# Patient Record
Sex: Male | Born: 1985 | Race: White | Hispanic: No | Marital: Married | Smoking: Never smoker
Health system: Southern US, Community
[De-identification: ages and names within clinical notes are randomized; demographics above are authoritative.]

## PROBLEM LIST (undated history)

## (undated) ENCOUNTER — Ambulatory Visit: Source: Home / Self Care

## (undated) DIAGNOSIS — E079 Disorder of thyroid, unspecified: Secondary | ICD-10-CM

## (undated) HISTORY — PX: APPENDECTOMY: SHX54

## (undated) HISTORY — PX: ANKLE SURGERY: SHX546

## (undated) HISTORY — PX: FASCIECTOMY: SHX6525

## (undated) HISTORY — PX: HEMORROIDECTOMY: SUR656

## (undated) HISTORY — PX: THYROIDECTOMY: SHX17

## (undated) HISTORY — PX: BRAIN SURGERY: SHX531

---

## 2016-10-17 DIAGNOSIS — G540 Brachial plexus disorders: Secondary | ICD-10-CM | POA: Insufficient documentation

## 2016-11-14 DIAGNOSIS — I471 Supraventricular tachycardia: Secondary | ICD-10-CM | POA: Insufficient documentation

## 2016-11-14 DIAGNOSIS — Z8679 Personal history of other diseases of the circulatory system: Secondary | ICD-10-CM | POA: Insufficient documentation

## 2017-06-05 DIAGNOSIS — G930 Cerebral cysts: Secondary | ICD-10-CM | POA: Insufficient documentation

## 2017-07-14 DIAGNOSIS — C73 Malignant neoplasm of thyroid gland: Secondary | ICD-10-CM | POA: Insufficient documentation

## 2018-07-17 DIAGNOSIS — E890 Postprocedural hypothyroidism: Secondary | ICD-10-CM | POA: Insufficient documentation

## 2019-03-05 DIAGNOSIS — K219 Gastro-esophageal reflux disease without esophagitis: Secondary | ICD-10-CM | POA: Insufficient documentation

## 2019-03-05 DIAGNOSIS — Z87898 Personal history of other specified conditions: Secondary | ICD-10-CM | POA: Insufficient documentation

## 2019-07-04 DIAGNOSIS — G8929 Other chronic pain: Secondary | ICD-10-CM | POA: Insufficient documentation

## 2019-12-20 DIAGNOSIS — N529 Male erectile dysfunction, unspecified: Secondary | ICD-10-CM | POA: Insufficient documentation

## 2020-02-07 DIAGNOSIS — R911 Solitary pulmonary nodule: Secondary | ICD-10-CM | POA: Insufficient documentation

## 2020-05-12 ENCOUNTER — Encounter (HOSPITAL_BASED_OUTPATIENT_CLINIC_OR_DEPARTMENT_OTHER): Payer: Self-pay

## 2020-05-12 ENCOUNTER — Emergency Department (HOSPITAL_BASED_OUTPATIENT_CLINIC_OR_DEPARTMENT_OTHER): Payer: 59

## 2020-05-12 ENCOUNTER — Emergency Department (HOSPITAL_BASED_OUTPATIENT_CLINIC_OR_DEPARTMENT_OTHER)
Admission: EM | Admit: 2020-05-12 | Discharge: 2020-05-12 | Disposition: A | Discharge status: Home / Self Care | Payer: 59 | Attending: Emergency Medicine | Admitting: Emergency Medicine

## 2020-05-12 ENCOUNTER — Other Ambulatory Visit: Payer: Self-pay

## 2020-05-12 DIAGNOSIS — R0789 Other chest pain: Secondary | ICD-10-CM | POA: Diagnosis not present

## 2020-05-12 DIAGNOSIS — R112 Nausea with vomiting, unspecified: Secondary | ICD-10-CM | POA: Insufficient documentation

## 2020-05-12 DIAGNOSIS — R1011 Right upper quadrant pain: Secondary | ICD-10-CM | POA: Insufficient documentation

## 2020-05-12 HISTORY — DX: Disorder of thyroid, unspecified: E07.9

## 2020-05-12 LAB — URINALYSIS, ROUTINE W REFLEX MICROSCOPIC
Bilirubin Urine: NEGATIVE
Glucose, UA: NEGATIVE mg/dL
Hgb urine dipstick: NEGATIVE
Ketones, ur: NEGATIVE mg/dL
Leukocytes,Ua: NEGATIVE
Nitrite: NEGATIVE
Protein, ur: NEGATIVE mg/dL
Specific Gravity, Urine: 1.025 (ref 1.005–1.030)
pH: 6 (ref 5.0–8.0)

## 2020-05-12 LAB — COMPREHENSIVE METABOLIC PANEL
ALT: 23 U/L (ref 0–44)
AST: 20 U/L (ref 15–41)
Albumin: 4.6 g/dL (ref 3.5–5.0)
Alkaline Phosphatase: 65 U/L (ref 38–126)
Anion gap: 11 (ref 5–15)
BUN: 12 mg/dL (ref 6–20)
CO2: 27 mmol/L (ref 22–32)
Calcium: 9.1 mg/dL (ref 8.9–10.3)
Chloride: 97 mmol/L — ABNORMAL LOW (ref 98–111)
Creatinine, Ser: 0.89 mg/dL (ref 0.61–1.24)
GFR calc Af Amer: >60 mL/min (ref >60)
GFR calc non Af Amer: >60 mL/min (ref >60)
Glucose, Bld: 67 mg/dL — ABNORMAL LOW (ref 70–99)
Potassium: 3.6 mmol/L (ref 3.5–5.1)
Sodium: 135 mmol/L (ref 135–145)
Total Bilirubin: 1.1 mg/dL (ref 0.3–1.2)
Total Protein: 7.7 g/dL (ref 6.5–8.1)

## 2020-05-12 LAB — LIPASE, BLOOD: Lipase: 101 U/L — ABNORMAL HIGH (ref 11–51)

## 2020-05-12 LAB — CBC
HCT: 47.2 % (ref 39.0–52.0)
Hemoglobin: 16.1 g/dL (ref 13.0–17.0)
MCH: 28.4 pg (ref 26.0–34.0)
MCHC: 34.1 g/dL (ref 30.0–36.0)
MCV: 83.4 fL (ref 80.0–100.0)
Platelets: 298 10*3/uL (ref 150–400)
RBC: 5.66 MIL/uL (ref 4.22–5.81)
RDW: 12.7 % (ref 11.5–15.5)
WBC: 7.1 10*3/uL (ref 4.0–10.5)
nRBC: 0 % (ref 0.0–0.2)

## 2020-05-12 LAB — CBG MONITORING, ED: Glucose-Capillary: 113 mg/dL — ABNORMAL HIGH (ref 70–99)

## 2020-05-12 MED ORDER — SODIUM CHLORIDE 0.9 % IV BOLUS
1000.0000 mL | Freq: Once | INTRAVENOUS | Status: AC
  Administered 2020-05-12: 1000 mL via INTRAVENOUS

## 2020-05-12 MED ORDER — SODIUM CHLORIDE 0.9% FLUSH
3.0000 mL | Freq: Once | INTRAVENOUS | Status: DC
  Filled 2020-05-12: qty 3

## 2020-05-12 MED ORDER — ONDANSETRON 4 MG PO TBDP
4.0000 mg | ORAL_TABLET | Freq: Three times a day (TID) | ORAL | 0 refills | Status: DC | PRN
Start: 2020-05-12 — End: 2022-05-17

## 2020-05-12 MED ORDER — IOHEXOL 300 MG/ML  SOLN
100.0000 mL | Freq: Once | INTRAMUSCULAR | Status: AC | PRN
  Administered 2020-05-12: 100 mL via INTRAVENOUS

## 2020-05-12 NOTE — Discharge Instructions (Addendum)
Take Zofran as needed for nausea.  Please follow-up with a primary care provider.  If you are unable to find a primary care provider you may also follow-up with [facility].  Return to the emergency room immediately for new or worsening symptoms or concerns, such as new or worsening abdominal pain, fevers, chest pain, shortness of breath or any concerns at all.

## 2020-05-12 NOTE — ED Provider Notes (Signed)
[facility] EMERGENCY DEPARTMENT Provider Note   CSN: [id number] Arrival date & time: 05/12/20  1557     History Chief Complaint  Patient presents with  . Abdominal Pain    Carl Tanner is a 34 y.o. male.  HPI  34 year old male, with a PMH of hypothyroidism, presents with right upper quadrant abdominal pain.  Patient states gradually over the last 5 days he has had worsening right upper quadrant abdominal pain.  He notes associated nausea and vomiting.  He states he has vomited one time today.  He notes constipation with his last bowel movement several days ago.  He denies any hematochezia, melena, hematemesis, fevers, chills.  Patient also complaining of chest pain to the left side of the chest which he states he has had for many months.  He states that symptoms have been gradually worsening.  He was told that he had a "spot on that side."  He would like to get that checked out while he is here.  He denies any shortness of breath.  Past Medical History:  Diagnosis Date  . Thyroid disease     There are no problems to display for this patient.   Past Surgical History:  Procedure Laterality Date  . ANKLE SURGERY    . APPENDECTOMY    . BRAIN SURGERY    . FASCIECTOMY    . HEMORROIDECTOMY    . THYROIDECTOMY         No family history on file.  Social History   Tobacco Use  . Smoking status: Never Smoker  . Smokeless tobacco: Never Used  Vaping Use  . Vaping Use: Every day  Substance Use Topics  . Alcohol use: Never  . Drug use: Never    Home Medications Prior to Admission medications   Not on File    Allergies    Benzodiazepines and Compazine [prochlorperazine]  Review of Systems   Review of Systems  Constitutional: Negative for chills and fever.  Respiratory: Negative for shortness of breath.   Cardiovascular: Positive for chest pain.  Gastrointestinal: Positive for abdominal pain, constipation, nausea and vomiting.  Genitourinary: Negative  for dysuria and frequency.  All other systems reviewed and are negative.   Physical Exam Updated Vital Signs BP 118/70 (BP Location: Right Arm)   Pulse 62   Temp 97.8 F (36.6 C) (Oral)   Resp 16   Ht 5\' 10"  (1.778 m)   Wt 103 kg   SpO2 99%   BMI 32.57 kg/m   Physical Exam Vitals and nursing note reviewed.  Constitutional:      Appearance: He is well-developed.  HENT:     Head: Normocephalic and atraumatic.  Eyes:     Conjunctiva/sclera: Conjunctivae normal.  Cardiovascular:     Rate and Rhythm: Normal rate and regular rhythm.     Heart sounds: Normal heart sounds. No murmur heard.   Pulmonary:     Effort: Pulmonary effort is normal. No respiratory distress.     Breath sounds: Normal breath sounds. No wheezing or rales.  Abdominal:     General: Bowel sounds are normal. There is no distension.     Palpations: Abdomen is soft.     Tenderness: There is abdominal tenderness in the right upper quadrant. Negative signs include McBurney's sign.  Musculoskeletal:        General: No tenderness or deformity. Normal range of motion.     Cervical back: Neck supple.  Skin:    General: Skin is warm and dry.  Findings: No erythema or rash.  Neurological:     Mental Status: He is alert and oriented to person, place, and time.  Psychiatric:        Behavior: Behavior normal.     ED Results / Procedures / Treatments   Labs (all labs ordered are listed, but only abnormal results are displayed) Labs Reviewed  LIPASE, BLOOD - Abnormal; Notable for the following components:      Result Value   Lipase 101 (*)    All other components within normal limits  COMPREHENSIVE METABOLIC PANEL - Abnormal; Notable for the following components:   Chloride 97 (*)    Glucose, Bld 67 (*)    All other components within normal limits  CBG MONITORING, ED - Abnormal; Notable for the following components:   Glucose-Capillary 113 (*)    All other components within normal limits  CBC    URINALYSIS, ROUTINE W REFLEX MICROSCOPIC    EKG None  Radiology DG Chest 2 View  Result Date: 05/12/2020 CLINICAL DATA:  Chest pain EXAM: CHEST - 2 VIEW COMPARISON:  None. FINDINGS: The heart size and mediastinal contours are within normal limits. Both lungs are clear. The visualized skeletal structures are unremarkable. IMPRESSION: No acute process in the chest. Electronically Signed   By: Macy Mis M.D.   On: 05/12/2020 20:04   CT Abdomen Pelvis W Contrast  Result Date: 05/12/2020 CLINICAL DATA:  Acute nonlocalized abdominal pain with nausea for 5 days and emesis. EXAM: CT ABDOMEN AND PELVIS WITH CONTRAST TECHNIQUE: Multidetector CT imaging of the abdomen and pelvis was performed using the standard protocol following bolus administration of intravenous contrast. CONTRAST:  140mL OMNIPAQUE IOHEXOL 300 MG/ML  SOLN COMPARISON:  CT 04/22/2020 FINDINGS: Lower chest: Lung bases are clear. Normal heart size. No pericardial effusion. Hepatobiliary: Few subcentimeter hypodense foci in the liver too small to fully characterize on CT imaging but statistically likely benign. No worrisome focal liver lesions. Smooth liver surface contour. Normal hepatic attenuation. Small prominent fold towards the gallbladder fundus. No visible calcified gallstones, pericholecystic inflammation or biliary ductal dilatation. Pancreas: Unremarkable. No pancreatic ductal dilatation or surrounding inflammatory changes. Spleen: Normal in size. No concerning splenic lesions. Adrenals/Urinary Tract: Normal adrenal glands. Kidneys enhance symmetrically and uniformly. No perinephric stranding. No concerning renal mass. Few subcentimeter hypodense foci in the left interpolar and lower pole kidney are incompletely characterized but likely benign. No urolithiasis or hydronephrosis. Urinary bladder is largely decompressed at the time of exam and therefore poorly evaluated by CT imaging. Some prominent bladder wall thickening is slightly  greater than expected with faint perivesicular hazy stranding as well. No visible bladder calculi or debris. Stomach/Bowel: Distal esophagus, stomach and duodenal sweep are unremarkable. No small bowel wall thickening or dilatation. No evidence of obstruction. The appendix is surgically absent. Cecum displaced slightly into the right upper quadrant. No evidence of resulting obstruction/volvulus. No colonic dilatation or wall thickening. Vascular/Lymphatic: No significant vascular findings are present. No enlarged abdominal or pelvic lymph nodes. Reproductive: Tiny 7 mm hypoattenuating focus in the midline prostate could reflect a small prosthetic utricle cyst or mullerian duct cyst. Additional more posteroinferior calculation towards the prosthetic apex is unchanged from prior and appears separate from the expected course of the ureter. Seminal vesicles are unremarkable. Other: No abdominopelvic free fluid or free gas. No bowel containing hernias. Small fat containing left inguinal hernia. Minimal subcutaneous thickening in the right anterior abdominal wall may reflect injectable use such as insulin. Musculoskeletal: No acute osseous abnormality or  suspicious osseous lesion. IMPRESSION: 1. Bladder is largely decompressed and poorly evaluated by CT imaging however some prominent bladder wall thickening is slightly greater than expected for underdistention with faint perivesicular hazy stranding as well. Correlate with urinalysis to exclude cystitis. 2. Tiny 7 mm hypoattenuating focus in the midline prostate could reflect a small prosthetic utricle cyst or mullerian duct cyst. Could consider outpatient urologic consultation. 3. Small fat containing left inguinal hernia. 4. Minimal subcutaneous thickening in the right anterior abdominal wall may reflect injectable use such as insulin. Electronically Signed   By: Lovena Le M.D.   On: 05/12/2020 21:07    Procedures Procedures (including critical care  time)  Medications Ordered in ED Medications  sodium chloride flush (NS) 0.9 % injection 3 mL (has no administration in time range)  iohexol (OMNIPAQUE) 300 MG/ML solution 100 mL (100 mLs Intravenous Contrast Given 05/12/20 2048)  sodium chloride 0.9 % bolus 1,000 mL (1,000 mLs Intravenous New Bag/Given 05/12/20 2147)    ED Course  I have reviewed the triage vital signs and the nursing notes.  Pertinent labs & imaging results that were available during my care of the patient were reviewed by me and considered in my medical decision making (see chart for details).    MDM Rules/Calculators/A&P                          Presents with right upper quadrant abdominal pain, nausea and vomiting.  He is tender in the right upper quadrant but does have a negative Murphy sign.  Blood work remarkable for hyperglycemia.  Lipase elevated at 101, 2 times upper limits of normal.  Urine shows no evidence of UTI.  Patient declined pain medicine in the ED.  CT shows multiple nonspecific findings.  In regards to the bladder findings, patient denies any urinary symptoms and urine is not suggestive of UTI.  No acute cholecystitis or pancreatitis on CT scan.  Discussed findings with patient.  He states that the bolus of fluids is helping his abdominal pain.  Discussed that CT is not the ideal imaging for acute cholecystitis and he was given strict return precautions in regards to this.  However given improvement in symptoms with normal saline, and minimal right upper quadrant tenderness, history of this was not consistent with acute cholecystitis.  Discussed elevated lipase with patient.  He is p.o. tolerant to crackers and Sprite in the room.  Sugars have improved to 113.  Patient given strict return precautions.  He is ready and stable for discharge.  In regards to patient's chest pain, this has been going on for once.  Chest x-ray is clear.  EKG shows no acute ST changes.  History and physical not consistent with ACS,  pericarditis, dissection.   At this time there does not appear to be any evidence of an acute emergency medical condition and the patient appears stable for discharge with appropriate outpatient follow up.Diagnosis was discussed with patient who verbalizes understanding and is agreeable to discharge. Pt case discussed with Dr. Ronnald Nian who agrees with my plan.    Final Clinical Impression(s) / ED Diagnoses Final diagnoses:  RUQ pain    Rx / DC Orders ED Discharge Orders    None       Etter Sjogren, PA-C 05/12/20 [place], [place], DO 05/12/20 2302

## 2020-05-12 NOTE — ED Notes (Signed)
Pt has a urinal. Pt is aware he needs a urine sample

## 2020-05-12 NOTE — ED Triage Notes (Addendum)
Pt c/o right side abd pain, nausea x 5 days-vomited x 1-NAD-steady gait

## 2020-05-17 ENCOUNTER — Encounter (HOSPITAL_BASED_OUTPATIENT_CLINIC_OR_DEPARTMENT_OTHER): Payer: Self-pay | Admitting: Emergency Medicine

## 2020-05-17 ENCOUNTER — Emergency Department (HOSPITAL_BASED_OUTPATIENT_CLINIC_OR_DEPARTMENT_OTHER)
Admission: EM | Admit: 2020-05-17 | Discharge: 2020-05-17 | Disposition: A | Discharge status: Home / Self Care | Payer: 59 | Attending: Emergency Medicine | Admitting: Emergency Medicine

## 2020-05-17 ENCOUNTER — Other Ambulatory Visit: Payer: Self-pay

## 2020-05-17 DIAGNOSIS — N3289 Other specified disorders of bladder: Secondary | ICD-10-CM

## 2020-05-17 DIAGNOSIS — R300 Dysuria: Secondary | ICD-10-CM | POA: Diagnosis not present

## 2020-05-17 DIAGNOSIS — R109 Unspecified abdominal pain: Secondary | ICD-10-CM | POA: Diagnosis present

## 2020-05-17 DIAGNOSIS — R1032 Left lower quadrant pain: Secondary | ICD-10-CM | POA: Diagnosis not present

## 2020-05-17 DIAGNOSIS — R319 Hematuria, unspecified: Secondary | ICD-10-CM | POA: Diagnosis not present

## 2020-05-17 DIAGNOSIS — Z9089 Acquired absence of other organs: Secondary | ICD-10-CM | POA: Diagnosis not present

## 2020-05-17 DIAGNOSIS — R1031 Right lower quadrant pain: Secondary | ICD-10-CM | POA: Insufficient documentation

## 2020-05-17 DIAGNOSIS — R110 Nausea: Secondary | ICD-10-CM | POA: Diagnosis not present

## 2020-05-17 LAB — COMPREHENSIVE METABOLIC PANEL
ALT: 17 U/L (ref 0–44)
AST: 19 U/L (ref 15–41)
Albumin: 4.3 g/dL (ref 3.5–5.0)
Alkaline Phosphatase: 54 U/L (ref 38–126)
Anion gap: 10 (ref 5–15)
BUN: 11 mg/dL (ref 6–20)
CO2: 24 mmol/L (ref 22–32)
Calcium: 9.1 mg/dL (ref 8.9–10.3)
Chloride: 102 mmol/L (ref 98–111)
Creatinine, Ser: 0.94 mg/dL (ref 0.61–1.24)
GFR calc Af Amer: >60 mL/min (ref >60)
GFR calc non Af Amer: >60 mL/min (ref >60)
Glucose, Bld: 97 mg/dL (ref 70–99)
Potassium: 3.8 mmol/L (ref 3.5–5.1)
Sodium: 136 mmol/L (ref 135–145)
Total Bilirubin: 0.7 mg/dL (ref 0.3–1.2)
Total Protein: 6.9 g/dL (ref 6.5–8.1)

## 2020-05-17 LAB — CBC WITH DIFFERENTIAL/PLATELET
Abs Immature Granulocytes: 0.05 10*3/uL (ref 0.00–0.07)
Basophils Absolute: 0.1 10*3/uL (ref 0.0–0.1)
Basophils Relative: 1 %
Eosinophils Absolute: 0.1 10*3/uL (ref 0.0–0.5)
Eosinophils Relative: 2 %
HCT: 44.2 % (ref 39.0–52.0)
Hemoglobin: 15.6 g/dL (ref 13.0–17.0)
Immature Granulocytes: 1 %
Lymphocytes Relative: 31 %
Lymphs Abs: 2.2 10*3/uL (ref 0.7–4.0)
MCH: 28.9 pg (ref 26.0–34.0)
MCHC: 35.3 g/dL (ref 30.0–36.0)
MCV: 81.9 fL (ref 80.0–100.0)
Monocytes Absolute: 0.7 10*3/uL (ref 0.1–1.0)
Monocytes Relative: 10 %
Neutro Abs: 4 10*3/uL (ref 1.7–7.7)
Neutrophils Relative %: 55 %
Platelets: 322 10*3/uL (ref 150–400)
RBC: 5.4 MIL/uL (ref 4.22–5.81)
RDW: 12.4 % (ref 11.5–15.5)
WBC: 7 10*3/uL (ref 4.0–10.5)
nRBC: 0 % (ref 0.0–0.2)

## 2020-05-17 LAB — URINALYSIS, MICROSCOPIC (REFLEX): RBC / HPF: NONE SEEN RBC/hpf (ref 0–5)

## 2020-05-17 LAB — URINALYSIS, ROUTINE W REFLEX MICROSCOPIC
Bilirubin Urine: NEGATIVE
Glucose, UA: NEGATIVE mg/dL
Hgb urine dipstick: NEGATIVE
Ketones, ur: NEGATIVE mg/dL
Nitrite: NEGATIVE
Protein, ur: NEGATIVE mg/dL
Specific Gravity, Urine: 1.01 (ref 1.005–1.030)
pH: 7 (ref 5.0–8.0)

## 2020-05-17 LAB — LIPASE, BLOOD: Lipase: 25 U/L (ref 11–51)

## 2020-05-17 MED ORDER — MORPHINE SULFATE (PF) 4 MG/ML IV SOLN
4.0000 mg | Freq: Once | INTRAVENOUS | Status: AC
  Administered 2020-05-17: 4 mg via INTRAVENOUS
  Filled 2020-05-17: qty 1

## 2020-05-17 MED ORDER — PHENAZOPYRIDINE HCL 100 MG PO TABS
200.0000 mg | ORAL_TABLET | Freq: Once | ORAL | Status: AC
  Administered 2020-05-17: 200 mg via ORAL
  Filled 2020-05-17: qty 2

## 2020-05-17 MED ORDER — ONDANSETRON HCL 4 MG/2ML IJ SOLN
4.0000 mg | Freq: Once | INTRAMUSCULAR | Status: AC
  Administered 2020-05-17: 4 mg via INTRAVENOUS
  Filled 2020-05-17: qty 2

## 2020-05-17 MED ORDER — PHENAZOPYRIDINE HCL 200 MG PO TABS: 200.0000 mg | ORAL_TABLET | Freq: Three times a day (TID) | ORAL | 0 refills | Status: DC

## 2020-05-17 NOTE — Discharge Instructions (Signed)
Please read and follow all provided instructions.  Your diagnoses today include:  1. Bladder spasms     Tests performed today include:  Blood counts and electrolytes  Blood tests to check liver and kidney function  Blood tests to check pancreas function  Urine test to look for infection  Vital signs. See below for your results today.   Medications prescribed:   Pyridium - medication for urinary tract infection symptoms.   This medication will turn your urine orange. This is normal.   Take any prescribed medications only as directed.  Home care instructions:   Follow any educational materials contained in this packet.  Follow-up instructions: Please follow-up with your primary care provider or the urologist listed in the next week for further evaluation of your symptoms.  Return instructions:  SEEK IMMEDIATE MEDICAL ATTENTION IF:  The pain does not go away or becomes severe   A temperature above 101F develops   Repeated vomiting occurs (multiple episodes)   The pain becomes localized to portions of the abdomen. The right side could possibly be appendicitis. In an adult, the left lower portion of the abdomen could be colitis or diverticulitis.   Blood is being passed in stools or vomit (bright red or black tarry stools)   You develop chest pain, difficulty breathing, dizziness or fainting, or become confused, poorly responsive, or inconsolable (young children)  If you have any other emergent concerns regarding your health  Additional Information: Abdominal (belly) pain can be caused by many things. Your caregiver performed an examination and possibly ordered blood/urine tests and imaging (CT scan, x-rays, ultrasound). Many cases can be observed and treated at home after initial evaluation in the emergency department. Even though you are being discharged home, abdominal pain can be unpredictable. Therefore, you need a repeated exam if your pain does not resolve,  returns, or worsens. Most patients with abdominal pain don't have to be admitted to the hospital or have surgery, but serious problems like appendicitis and gallbladder attacks can start out as nonspecific pain. Many abdominal conditions cannot be diagnosed in one visit, so follow-up evaluations are very important.  Your vital signs today were: BP 116/76 (BP Location: Right Arm)   Pulse 87   Temp 98.6 F (37 C) (Oral)   Resp 20   Ht 5\' 10"  (1.778 m)   Wt 103 kg   SpO2 99%   BMI 32.57 kg/m  If your blood pressure (bp) was elevated above 135/85 this visit, please have this repeated by your doctor within one month. --------------

## 2020-05-17 NOTE — ED Provider Notes (Signed)
[facility] EMERGENCY DEPARTMENT Provider Note   CSN: [id number] Arrival date & time: 05/17/20  1635     History Chief Complaint  Patient presents with  . Abdominal Pain    Carl Tanner is a 34 y.o. male.  Patient with history of appendectomy, history of testicle swelling and hematuria, states he was seen about 8 months ago by a urologist who wanted to do a cystoscopy, but then his urologist left and he never had this done. ED visit on 05/12/2020 for right upper quadrant pain with CT with questionable bladder wall thickening and surrounding stranding, normal UA, elevated lipase, normal white blood cell count.  He presents the emergency department today with complaint of bladder spasms and lower abdominal pain as well as ongoing, but improved overall right upper quadrant pain.  Patient denies fevers, chest pain, cough or shortness of breath.  He has had nausea but no vomiting.  He states that this morning he noted a larger amount of blood in his urine which worried him prompting emergency department visit.  He also states that he has extreme urgency to the point where he urinates on himself.  He states that he was incontinent 3 times this morning.  He states later this morning he went to a grocery store and lifted a case of water and has had pain in his middle back since that time.  No treatments prior to arrival.  He does not recall ever having a UTI. Pt with chronic rx for oxycodone 5mg , Vyvanse, and gabapentin.         Past Medical History:  Diagnosis Date  . Thyroid disease     There are no problems to display for this patient.   Past Surgical History:  Procedure Laterality Date  . ANKLE SURGERY    . APPENDECTOMY    . BRAIN SURGERY    . FASCIECTOMY    . HEMORROIDECTOMY    . THYROIDECTOMY         No family history on file.  Social History   Tobacco Use  . Smoking status: Never Smoker  . Smokeless tobacco: Never Used  Vaping Use  . Vaping Use: Every day    Substance Use Topics  . Alcohol use: Never  . Drug use: Never    Home Medications Prior to Admission medications   Medication Sig Start Date End Date Taking? Authorizing Provider  ondansetron (ZOFRAN ODT) 4 MG disintegrating tablet Take 1 tablet (4 mg total) by mouth every 8 (eight) hours as needed for nausea or vomiting. 05/12/20   Kendrick, Caitlyn S, PA-C    Allergies    Benzodiazepines and Compazine [prochlorperazine]  Review of Systems   Review of Systems  Constitutional: Negative for fever.  HENT: Negative for rhinorrhea and sore throat.   Eyes: Negative for redness.  Respiratory: Negative for cough and shortness of breath.   Cardiovascular: Negative for chest pain.  Gastrointestinal: Positive for abdominal pain and nausea. Negative for diarrhea and vomiting.  Genitourinary: Positive for dysuria, frequency, hematuria, testicular pain and urgency. Negative for discharge, flank pain and scrotal swelling.  Musculoskeletal: Positive for back pain. Negative for myalgias.  Skin: Negative for rash.  Neurological: Negative for headaches.    Physical Exam Updated Vital Signs BP 117/73 (BP Location: Left Arm)   Pulse 63   Temp 98.6 F (37 C) (Oral)   Resp 18   Ht 5\' 10"  (1.778 m)   Wt 103 kg   SpO2 99%   BMI 32.57 kg/m  Physical Exam Vitals and nursing note reviewed.  Constitutional:      Appearance: He is well-developed.  HENT:     Head: Normocephalic and atraumatic.  Eyes:     General:        Right eye: No discharge.        Left eye: No discharge.     Conjunctiva/sclera: Conjunctivae normal.  Cardiovascular:     Rate and Rhythm: Normal rate and regular rhythm.     Heart sounds: Normal heart sounds.  Pulmonary:     Effort: Pulmonary effort is normal.     Breath sounds: Normal breath sounds.  Abdominal:     Palpations: Abdomen is soft.     Tenderness: There is abdominal tenderness in the right lower quadrant, suprapubic area and left lower quadrant.      Hernia: No hernia is present.     Comments: No significant right upper quadrant tenderness at time of exam.  Genitourinary:    Testes: Normal.        Right: Mass, tenderness or swelling not present.        Left: Mass, tenderness or swelling not present.  Musculoskeletal:     Cervical back: Normal range of motion and neck supple.  Skin:    General: Skin is warm and dry.  Neurological:     Mental Status: He is alert.     ED Results / Procedures / Treatments   Labs (all labs ordered are listed, but only abnormal results are displayed) Labs Reviewed  URINALYSIS, ROUTINE W REFLEX MICROSCOPIC - Abnormal; Notable for the following components:      Result Value   Leukocytes,Ua TRACE (*)    All other components within normal limits  URINALYSIS, MICROSCOPIC (REFLEX) - Abnormal; Notable for the following components:   Bacteria, UA RARE (*)    All other components within normal limits  URINE CULTURE  CBC WITH DIFFERENTIAL/PLATELET  COMPREHENSIVE METABOLIC PANEL  LIPASE, BLOOD    EKG None  Radiology No results found.  Procedures Procedures (including critical care time)  Medications Ordered in ED Medications  phenazopyridine (PYRIDIUM) tablet 200 mg (200 mg Oral Given 05/17/20 1759)  morphine 4 MG/ML injection 4 mg (4 mg Intravenous Given 05/17/20 1759)  ondansetron (ZOFRAN) injection 4 mg (4 mg Intravenous Given 05/17/20 1759)    ED Course  I have reviewed the triage vital signs and the nursing notes.  Pertinent labs & imaging results that were available during my care of the patient were reviewed by me and considered in my medical decision making (see chart for details).  Patient seen and examined. Work-up initiated. Medications ordered.  Reviewed work-up from previous ED visit.  Reviewed CT imaging results.  Vital signs reviewed and are as follows: BP 117/73 (BP Location: Left Arm)   Pulse 63   Temp 98.6 F (37 C) (Oral)   Resp 18   Ht 5\' 10"  (1.778 m)   Wt 103 kg    SpO2 99%   BMI 32.57 kg/m   Patient with several other visits at outside ED's. CT on April 22 2020 was negative. Hematuria reported at that time.   Multiple recent CK testing negative.   MRI brain 7/21 negative  11/18/2019: total spine MRI  Mild disc protrusions from T4 through T7. Mild impression on the anterior  spinal cord without spinal canal stenosis  7:17 PM patient updated on results.  Exam is unchanged.  Plan for discharge to home with PCP/urology follow-up.  Will give course of Azo  to see if this helps his symptoms.  The patient was urged to return to the Emergency Department immediately with worsening of current symptoms, worsening abdominal pain, persistent vomiting, blood noted in stools, fever, or any other concerns. The patient verbalized understanding.     MDM Rules/Calculators/A&P                          Patient with lower abdominal pain today and complaint of hematuria.  Work-up is essentially negative.  Urine culture sent.  Reviewed previous work-up and CT imaging.  I do not feel that we need to repeat this tonight given reassuring evaluation.  Patient encouraged to follow-up with PCP and urologist.  Referral given.     Final Clinical Impression(s) / ED Diagnoses Final diagnoses:  Bladder spasms    Rx / DC Orders ED Discharge Orders    None       Carlisle Cater, PA-C 05/17/20 1919    Lucrezia Starch, MD 05/21/20 [phone]

## 2020-05-17 NOTE — ED Triage Notes (Addendum)
Pt c/o bladder pain, left lower abdominal pain, left low back pain, blood in urine and incontinence of urine ongoing from a week ago.

## 2020-05-18 LAB — URINE CULTURE: Culture: NO GROWTH

## 2020-06-30 ENCOUNTER — Other Ambulatory Visit: Payer: Self-pay

## 2020-06-30 ENCOUNTER — Encounter (HOSPITAL_BASED_OUTPATIENT_CLINIC_OR_DEPARTMENT_OTHER): Payer: Self-pay | Admitting: *Deleted

## 2020-06-30 ENCOUNTER — Emergency Department (HOSPITAL_BASED_OUTPATIENT_CLINIC_OR_DEPARTMENT_OTHER): Payer: 59

## 2020-06-30 ENCOUNTER — Emergency Department (HOSPITAL_BASED_OUTPATIENT_CLINIC_OR_DEPARTMENT_OTHER)
Admission: EM | Admit: 2020-06-30 | Discharge: 2020-06-30 | Disposition: A | Discharge status: Home / Self Care | Payer: 59 | Attending: Emergency Medicine | Admitting: Emergency Medicine

## 2020-06-30 DIAGNOSIS — R300 Dysuria: Secondary | ICD-10-CM | POA: Diagnosis not present

## 2020-06-30 DIAGNOSIS — R748 Abnormal levels of other serum enzymes: Secondary | ICD-10-CM | POA: Diagnosis not present

## 2020-06-30 DIAGNOSIS — R319 Hematuria, unspecified: Secondary | ICD-10-CM | POA: Diagnosis present

## 2020-06-30 LAB — URINALYSIS, ROUTINE W REFLEX MICROSCOPIC
Bilirubin Urine: NEGATIVE
Glucose, UA: NEGATIVE mg/dL
Hgb urine dipstick: NEGATIVE
Ketones, ur: NEGATIVE mg/dL
Leukocytes,Ua: NEGATIVE
Nitrite: NEGATIVE
Protein, ur: NEGATIVE mg/dL
Specific Gravity, Urine: 1.015 (ref 1.005–1.030)
pH: 8.5 — ABNORMAL HIGH (ref 5.0–8.0)

## 2020-06-30 LAB — CBC
HCT: 45.7 % (ref 39.0–52.0)
Hemoglobin: 15.6 g/dL (ref 13.0–17.0)
MCH: 28.2 pg (ref 26.0–34.0)
MCHC: 34.1 g/dL (ref 30.0–36.0)
MCV: 82.6 fL (ref 80.0–100.0)
Platelets: 317 10*3/uL (ref 150–400)
RBC: 5.53 MIL/uL (ref 4.22–5.81)
RDW: 13 % (ref 11.5–15.5)
WBC: 7.8 10*3/uL (ref 4.0–10.5)
nRBC: 0 % (ref 0.0–0.2)

## 2020-06-30 LAB — COMPREHENSIVE METABOLIC PANEL
ALT: 13 U/L (ref 0–44)
AST: 15 U/L (ref 15–41)
Albumin: 4.5 g/dL (ref 3.5–5.0)
Alkaline Phosphatase: 61 U/L (ref 38–126)
Anion gap: 9 (ref 5–15)
BUN: 10 mg/dL (ref 6–20)
CO2: 26 mmol/L (ref 22–32)
Calcium: 9 mg/dL (ref 8.9–10.3)
Chloride: 100 mmol/L (ref 98–111)
Creatinine, Ser: 0.89 mg/dL (ref 0.61–1.24)
GFR calc Af Amer: >60 mL/min (ref >60)
GFR calc non Af Amer: >60 mL/min (ref >60)
Glucose, Bld: 97 mg/dL (ref 70–99)
Potassium: 3.9 mmol/L (ref 3.5–5.1)
Sodium: 135 mmol/L (ref 135–145)
Total Bilirubin: 1.2 mg/dL (ref 0.3–1.2)
Total Protein: 7.2 g/dL (ref 6.5–8.1)

## 2020-06-30 LAB — LIPASE, BLOOD: Lipase: 91 U/L — ABNORMAL HIGH (ref 11–51)

## 2020-06-30 MED ORDER — OXYBUTYNIN CHLORIDE ER 5 MG PO TB24: 5.0000 mg | ORAL_TABLET | Freq: Every day | ORAL | 0 refills | Status: DC

## 2020-06-30 MED FILL — OXYBUTYNIN CL ER 5 MG TAB: 5 | 14 days supply | Qty: 14 | Fill #0

## 2020-06-30 NOTE — ED Notes (Signed)
Bladder scan 3 times: 19ml, 40ml, 51ml.

## 2020-06-30 NOTE — ED Triage Notes (Signed)
Hematuria. Hx of same. Reports retention and lower pain that started this morning. Pt seen at [facility] yesterday for same. Pt has urology appointment on 9-30

## 2020-06-30 NOTE — Discharge Instructions (Addendum)
Take the medications as prescribed.  Follow-up with urologist as planned.

## 2020-06-30 NOTE — ED Provider Notes (Signed)
[facility] EMERGENCY DEPARTMENT Provider Note   CSN: [id number] Arrival date & time: 06/30/20  1113     History Chief Complaint  Patient presents with  . Hematuria    Carl Tanner is a 34 y.o. male.  HPI   Patient states he has had urinary symptoms ongoing now for a month or so.  Patient is having issues with urinary frequency and urgency.  Patient finds that after he urinates he feels like he will have to go again shortly thereafter.  He has noticed intermittent blood in his urine.  He is having discomfort in his abdomen.  He is also having pain in his back.  He also has pain and discomfort in his testicles and has lost about 20 pounds recently.  Patient states he does have an appointment scheduled with the doctor the end of this month but the symptoms were worsening so he came to the ED. Pt denies any recent unprotected intercourse (states he has difficulty with impotence, not associated with these symptoms).  Past Medical History:  Diagnosis Date  . Thyroid disease     There are no problems to display for this patient.   Past Surgical History:  Procedure Laterality Date  . ANKLE SURGERY    . APPENDECTOMY    . BRAIN SURGERY    . FASCIECTOMY    . HEMORROIDECTOMY    . THYROIDECTOMY         History reviewed. No pertinent family history.  Social History   Tobacco Use  . Smoking status: Never Smoker  . Smokeless tobacco: Never Used  Vaping Use  . Vaping Use: Every day  Substance Use Topics  . Alcohol use: Never  . Drug use: Never    Home Medications Prior to Admission medications   Medication Sig Start Date End Date Taking? Authorizing Provider  ondansetron (ZOFRAN ODT) 4 MG disintegrating tablet Take 1 tablet (4 mg total) by mouth every 8 (eight) hours as needed for nausea or vomiting. 05/12/20   Kendrick, Caitlyn S, PA-C  oxybutynin (DITROPAN XL) 5 MG 24 hr tablet Take 1 tablet (5 mg total) by mouth at bedtime. 06/30/20   Dorie Rank, MD    phenazopyridine (PYRIDIUM) 200 MG tablet Take 1 tablet (200 mg total) by mouth 3 (three) times daily. 05/17/20   Carlisle Cater, PA-C    Allergies    Benzodiazepines and Compazine [prochlorperazine]  Review of Systems   Review of Systems  All other systems reviewed and are negative.   Physical Exam Updated Vital Signs BP 123/80 (BP Location: Right Arm)   Pulse 75   Temp 98.2 F (36.8 C) (Oral)   Resp 19   Ht 1.778 m (5\' 10" )   Wt 98.4 kg   SpO2 100%   BMI 31.14 kg/m   Physical Exam Vitals and nursing note reviewed.  Constitutional:      General: He is not in acute distress.    Appearance: He is well-developed.  HENT:     Head: Normocephalic and atraumatic.     Right Ear: External ear normal.     Left Ear: External ear normal.  Eyes:     General: No scleral icterus.       Right eye: No discharge.        Left eye: No discharge.     Conjunctiva/sclera: Conjunctivae normal.  Neck:     Trachea: No tracheal deviation.  Cardiovascular:     Rate and Rhythm: Normal rate and regular rhythm.  Pulmonary:  Effort: Pulmonary effort is normal. No respiratory distress.     Breath sounds: Normal breath sounds. No stridor. No wheezing or rales.  Abdominal:     General: Bowel sounds are normal. There is no distension.     Palpations: Abdomen is soft. There is no mass.     Tenderness: There is abdominal tenderness. There is no guarding or rebound.     Hernia: No hernia is present.  Genitourinary:    Penis: Normal.      Testes: Normal.  Musculoskeletal:        General: No tenderness.     Cervical back: Neck supple.  Skin:    General: Skin is warm and dry.     Findings: No rash.  Neurological:     Mental Status: He is alert.     Cranial Nerves: No cranial nerve deficit (no facial droop, extraocular movements intact, no slurred speech).     Sensory: No sensory deficit.     Motor: No abnormal muscle tone or seizure activity.     Coordination: Coordination normal.     ED  Results / Procedures / Treatments   Labs (all labs ordered are listed, but only abnormal results are displayed) Labs Reviewed  URINALYSIS, ROUTINE W REFLEX MICROSCOPIC - Abnormal; Notable for the following components:      Result Value   pH 8.5 (*)    All other components within normal limits  LIPASE, BLOOD - Abnormal; Notable for the following components:   Lipase 91 (*)    All other components within normal limits  CBC  COMPREHENSIVE METABOLIC PANEL    EKG None  Radiology US Scrotum  Result Date: 06/30/2020 CLINICAL DATA:  Testicular pain EXAM: SCROTAL ULTRASOUND DOPPLER ULTRASOUND OF THE TESTICLES TECHNIQUE: Complete ultrasound examination of the testicles, epididymis, and other scrotal structures was performed. Color and spectral Doppler ultrasound were also utilized to evaluate blood flow to the testicles. COMPARISON:  None. FINDINGS: Right testicle Measurements: 4.7 x 2.4 by 2.6 cm. No mass or microlithiasis visualized. There is a single echogenic focus likely reflecting a punctate calcification. Left testicle Measurements: 5.0 x 2.2 x 2.9 cm. No mass or microlithiasis visualized. Right epididymis:  Normal in size and appearance. Left epididymis:  Normal in size and appearance. Hydrocele:  None visualized. Varicocele:  None visualized. Pulsed Doppler interrogation of both testes demonstrates normal low resistance arterial and venous waveforms bilaterally. IMPRESSION: No sonographic etiology for testicular pain identified. Electronically Signed   By: Valentino Saxon MD   On: 06/30/2020 12:27    Procedures Procedures (including critical care time)  Medications Ordered in ED Medications - No data to display  ED Course  I have reviewed the triage vital signs and the nursing notes.  Pertinent labs & imaging results that were available during my care of the patient were reviewed by me and considered in my medical decision making (see chart for details).  Clinical Course as of  Jun 30 1516  Tue Jun 30, 2020  1435 Lipase slightly elevated.  Similar to previous   [JK]  1435 Ua negative.  CBC and CMET normal   [JK]    Clinical Course User Index [JK] Dorie Rank, MD   MDM Rules/Calculators/A&P                          Patient presented to the ED for evaluation of abdominal pain and urinary symptoms.  Urinalysis does not suggest infection.  No hematuria.  Patient's  bladder scan does not show any evidence of urinary retention.  Scrotal ultrasound does not show any acute abnormalities.  Patient does have a slightly elevated lipase but this would not account for his urinary symptoms.  He has had this before.  Will discharge home with prescription for Ditropan.  Outpatient follow-up with urology. Final Clinical Impression(s) / ED Diagnoses Final diagnoses:  Dysuria  Elevated lipase    Rx / DC Orders ED Discharge Orders         Ordered    oxybutynin (DITROPAN XL) 5 MG 24 hr tablet  Daily at bedtime,   Status:  Discontinued        06/30/20 1516    oxybutynin (DITROPAN XL) 5 MG 24 hr tablet  Daily at bedtime        06/30/20 1516           Dorie Rank, MD 06/30/20 1519

## 2020-06-30 NOTE — ED Notes (Signed)
ED Provider at bedside. 

## 2020-07-01 ENCOUNTER — Encounter (HOSPITAL_BASED_OUTPATIENT_CLINIC_OR_DEPARTMENT_OTHER): Payer: Self-pay

## 2020-07-01 ENCOUNTER — Emergency Department (HOSPITAL_BASED_OUTPATIENT_CLINIC_OR_DEPARTMENT_OTHER)
Admission: EM | Admit: 2020-07-01 | Discharge: 2020-07-01 | Disposition: A | Discharge status: Home / Self Care | Payer: 59 | Attending: Emergency Medicine | Admitting: Emergency Medicine

## 2020-07-01 ENCOUNTER — Other Ambulatory Visit: Payer: Self-pay

## 2020-07-01 ENCOUNTER — Emergency Department (HOSPITAL_BASED_OUTPATIENT_CLINIC_OR_DEPARTMENT_OTHER): Payer: 59

## 2020-07-01 DIAGNOSIS — Z79899 Other long term (current) drug therapy: Secondary | ICD-10-CM | POA: Diagnosis not present

## 2020-07-01 DIAGNOSIS — W01198A Fall on same level from slipping, tripping and stumbling with subsequent striking against other object, initial encounter: Secondary | ICD-10-CM | POA: Insufficient documentation

## 2020-07-01 DIAGNOSIS — E039 Hypothyroidism, unspecified: Secondary | ICD-10-CM | POA: Insufficient documentation

## 2020-07-01 DIAGNOSIS — S99912A Unspecified injury of left ankle, initial encounter: Secondary | ICD-10-CM | POA: Diagnosis not present

## 2020-07-01 DIAGNOSIS — N50812 Left testicular pain: Secondary | ICD-10-CM | POA: Diagnosis not present

## 2020-07-01 DIAGNOSIS — N50811 Right testicular pain: Secondary | ICD-10-CM | POA: Diagnosis not present

## 2020-07-01 DIAGNOSIS — S91332A Puncture wound without foreign body, left foot, initial encounter: Secondary | ICD-10-CM | POA: Insufficient documentation

## 2020-07-01 MED ORDER — CEPHALEXIN 250 MG PO CAPS
500.0000 mg | ORAL_CAPSULE | Freq: Once | ORAL | Status: AC
  Administered 2020-07-01: 500 mg via ORAL
  Filled 2020-07-01: qty 2

## 2020-07-01 MED ORDER — OXYCODONE-ACETAMINOPHEN 5-325 MG PO TABS: 1.0000 | ORAL_TABLET | Freq: Four times a day (QID) | ORAL | 0 refills | Status: DC | PRN

## 2020-07-01 MED ORDER — HYDROCODONE-ACETAMINOPHEN 5-325 MG PO TABS
1.0000 | ORAL_TABLET | Freq: Once | ORAL | Status: AC
  Administered 2020-07-01: 1 via ORAL
  Filled 2020-07-01: qty 1

## 2020-07-01 MED ORDER — CEPHALEXIN 500 MG PO CAPS: 500.0000 mg | ORAL_CAPSULE | Freq: Three times a day (TID) | ORAL | 0 refills | Status: AC

## 2020-07-01 NOTE — ED Notes (Signed)
Wound irrigated per provider order. Provider informed.

## 2020-07-01 NOTE — ED Provider Notes (Addendum)
[facility] EMERGENCY DEPARTMENT Provider Note   CSN: [id number] Arrival date & time: 07/01/20  1726     History Chief Complaint  Patient presents with  . Ankle Injury    Carl Tanner is a 34 y.o. male presenting to the ED with complaint of left ankle pain after falling onto a plastic Bluetooth speaker prior to arrival.  Patient stated he got tripped and fell back around his tailbone.  When he fell his left foot had a speaker and shattered it, piece of the plastic speaker got lodged in the posterior aspect of his left ankle.  Patient with the plastic himself prior to arrival to the ED.  Patient states pain is 9/10 severity, sharp and located to the heel.  Patient states he has some tingling sensation to his foot.  Pain is worse with movement.    The history is provided by the patient.       Past Medical History:  Diagnosis Date  . Thyroid disease     There are no problems to display for this patient.   Past Surgical History:  Procedure Laterality Date  . ANKLE SURGERY    . APPENDECTOMY    . BRAIN SURGERY    . FASCIECTOMY    . HEMORROIDECTOMY    . THYROIDECTOMY         No family history on file.  Social History   Tobacco Use  . Smoking status: Never Smoker  . Smokeless tobacco: Never Used  Vaping Use  . Vaping Use: Every day  Substance Use Topics  . Alcohol use: Never  . Drug use: Never    Home Medications Prior to Admission medications   Medication Sig Start Date End Date Taking? Authorizing Provider  baclofen (LIORESAL) 10 MG tablet Take 10 mg by mouth 3 (three) times daily. 03/06/20   [provider]  BYSTOLIC 10 MG tablet Take 10 mg by mouth 2 (two) times daily. 06/23/20   [provider]  cephALEXin (KEFLEX) 500 MG capsule Take 1 capsule (500 mg total) by mouth 3 (three) times daily for 7 days. 07/01/20 07/08/20  Robbert Langlinais, [place] N, PA-C  CORLANOR 5 MG TABS tablet Take 5 mg by mouth 2 (two) times daily. 05/13/20   [provider]  folic acid (FOLVITE) 1 MG tablet Take 1 mg by mouth daily. 03/16/20   [provider]  gabapentin (NEURONTIN) 400 MG capsule Take 400 mg by mouth 3 (three) times daily. 01/30/20   [provider]  HYDROmorphone (DILAUDID) 2 MG tablet SMARTSIG:3 By Mouth 4-5 Times Daily 06/22/20   [provider]  ibuprofen (ADVIL) 800 MG tablet Take 800 mg by mouth 3 (three) times daily. 06/22/20   [provider]  levothyroxine (SYNTHROID) 175 MCG tablet Take 175 mcg by mouth daily. 06/19/20   [provider]  methotrexate (RHEUMATREX) 2.5 MG tablet Take 15 mg by mouth once a week. 03/16/20   [provider]  metoprolol tartrate (LOPRESSOR) 25 MG tablet Take 25 mg by mouth daily. 06/17/20   [provider]  mirtazapine (REMERON) 15 MG tablet Take 15 mg by mouth at bedtime. 06/17/20   [provider]  omeprazole (PRILOSEC) 40 MG capsule Take 40 mg by mouth daily. 04/30/20   [provider]  ondansetron (ZOFRAN ODT) 4 MG disintegrating tablet Take 1 tablet (4 mg total) by mouth every 8 (eight) hours as needed for nausea or vomiting. 05/12/20   Kendrick, Caitlyn S, PA-C  oxybutynin (DITROPAN XL) 5  MG 24 hr tablet Take 1 tablet (5 mg total) by mouth at bedtime. 06/30/20   Dorie Rank, MD  oxyCODONE (OXY IR/ROXICODONE) 5 MG immediate release tablet Take by mouth. 05/05/20   [provider]  oxyCODONE-acetaminophen (PERCOCET/ROXICET) 5-325 MG tablet Take 1 tablet by mouth every 6 (six) hours as needed for severe pain. 07/01/20   Reynold Mantell, [place] N, PA-C  phenazopyridine (PYRIDIUM) 200 MG tablet Take 1 tablet (200 mg total) by mouth 3 (three) times daily. 05/17/20   Carlisle Cater, PA-C  pregabalin (LYRICA) 75 MG capsule Take by mouth. 04/24/20   [provider]  VYVANSE 20 MG capsule Take 20 mg by mouth every morning. 05/21/20   [provider]    Allergies    Benzodiazepines and Compazine [prochlorperazine]  Review  of Systems   Review of Systems  Musculoskeletal: Positive for myalgias.  Skin: Positive for wound.    Physical Exam Updated Vital Signs BP (!) 150/91 (BP Location: Left Arm)   Pulse 82   Temp 98.9 F (37.2 C) (Oral)   Resp 18   SpO2 99%   Physical Exam Vitals and nursing note reviewed.  Constitutional:      Appearance: He is well-developed.  HENT:     Head: Normocephalic and atraumatic.  Eyes:     Conjunctiva/sclera: Conjunctivae normal.  Cardiovascular:     Rate and Rhythm: Normal rate.     Comments: Strong DP pulse.  Pulmonary:     Effort: Pulmonary effort is normal.  Musculoskeletal:     Comments: Left posterior ankle/lower leg with 1cm puncture wound/laceration midline overlying the achilles, about 4inches proximal to the heel. The achilles appears largely intact with normal thompson test and palpation. Pt is able to dorsiflex the ankle though does have pain.  The wound was probed without evidence of retained foreign body.  Does not appear to be deeper than 1/4 to 1/2 cm.  There is no significant surrounding swelling.  Neurological:     Mental Status: He is alert.     Comments: Intact distal sensation  Psychiatric:        Mood and Affect: Mood normal.        Behavior: Behavior normal.     ED Results / Procedures / Treatments   Labs (all labs ordered are listed, but only abnormal results are displayed) Labs Reviewed - No data to display  EKG None  Radiology DG Ankle Complete Left  Result Date: 07/01/2020 CLINICAL DATA:  Status post trauma. EXAM: LEFT ANKLE COMPLETE - 3+ VIEW COMPARISON:  None. FINDINGS: There is no evidence of fracture, dislocation, or joint effusion. There is no evidence of arthropathy or other focal bone abnormality. Soft tissues are unremarkable. IMPRESSION: Negative. Electronically Signed   By: Virgina Norfolk M.D.   On: 07/01/2020 18:17   US Scrotum  Result Date: 06/30/2020 CLINICAL DATA:  Testicular pain EXAM: SCROTAL ULTRASOUND  DOPPLER ULTRASOUND OF THE TESTICLES TECHNIQUE: Complete ultrasound examination of the testicles, epididymis, and other scrotal structures was performed. Color and spectral Doppler ultrasound were also utilized to evaluate blood flow to the testicles. COMPARISON:  None. FINDINGS: Right testicle Measurements: 4.7 x 2.4 by 2.6 cm. No mass or microlithiasis visualized. There is a single echogenic focus likely reflecting a punctate calcification. Left testicle Measurements: 5.0 x 2.2 x 2.9 cm. No mass or microlithiasis visualized. Right epididymis:  Normal in size and appearance. Left epididymis:  Normal in size and appearance. Hydrocele:  None visualized. Varicocele:  None visualized. Pulsed Doppler interrogation  of both testes demonstrates normal low resistance arterial and venous waveforms bilaterally. IMPRESSION: No sonographic etiology for testicular pain identified. Electronically Signed   By: Valentino Saxon MD   On: 06/30/2020 12:27    Procedures .Marland KitchenLaceration Repair  Date/Time: 07/01/2020 9:49 PM Performed by: Kaidin Boehle, [place] N, PA-C Authorized by: Jeymi Hepp, [place] N, PA-C   Consent:    Consent obtained:  Verbal   Consent given by:  Patient   Risks discussed:  Infection and retained foreign body   Alternatives discussed:  Observation Anesthesia (see MAR for exact dosages):    Anesthesia method:  None Laceration details:    Location:  Foot   Foot location:  L heel   Length (cm):  1 Repair type:    Repair type:  Simple Exploration:    Hemostasis achieved with:  Direct pressure   Wound exploration: wound explored through full range of motion and entire depth of wound probed and visualized     Wound extent: no foreign bodies/material noted     Wound extent comment:  Possible minimal tendon damage. No obvious foreign body noted   Contaminated: no   Treatment:    Area cleansed with:  Saline   Amount of cleaning:  Extensive   Irrigation method:  Pressure wash   Visualized foreign  bodies/material removed: no   Skin repair:    Repair method:  Steri-Strips   Number of Steri-Strips:  3 Approximation:    Approximation:  Close Post-procedure details:    Dressing:  Non-adherent dressing (ace wrap)   Patient tolerance of procedure:  Tolerated well, no immediate complications   (including critical care time)  Medications Ordered in ED Medications  HYDROcodone-acetaminophen (NORCO/VICODIN) 5-325 MG per tablet 1 tablet (1 tablet Oral Given 07/01/20 2121)  cephALEXin (KEFLEX) capsule 500 mg (500 mg Oral Given 07/01/20 2121)    ED Course  I have reviewed the triage vital signs and the nursing notes.  Pertinent labs & imaging results that were available during my care of the patient were reviewed by me and considered in my medical decision making (see chart for details).    MDM Rules/Calculators/A&P                          Patient sustained puncture wound overlying left Achilles when he stepped into the base of a Bluetooth speaker.  He states he pulled a piece of plastic from his skin.  He has having localized pain to the area and pain with movement of the Achilles.  The Achilles appears largely intact with normal Thompson test, palpable intact Achilles, patient is able to dorsiflex the foot though does have pain.  Neurovascular intact. Xray negative.  Wound was probed without evidence of foreign body or any obvious large tendon damage.  Wound was copiously irrigated and approximated with Steri-Strips.  Ace wrap was applied, crutches provided for nonweightbearing.  Pain treated.  Patient is recently on chronic pain medication.  He states he weaned himself off and returned any remaining tablets to his pain specialist last week.  He dates it has been about 1 week since pain medication was taken.  Patient prophylactically treated with Keflex given possibility of wound to the tendon sheath.  He will be discharged with Keflex prescription, very small amount of pain medication given  patient's difficulty managing pain with over-the-counter medications.  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  Discussed importance of outpatient follow-up, wound care, monitoring  for signs of infection.  He is given outpatient follow-up and strict return precautions.  Also had lengthy discussion with patient about risk of retained foreign body and possible tendon injury.  Patient verbalized understanding and importance of follow-up as well as nonweightbearing and wound care.  Patient discharged.  Discussed results, findings, treatment and follow up. Patient advised of return precautions. Patient verbalized understanding and agreed with plan.  Final Clinical Impression(s) / ED Diagnoses Final diagnoses:  Puncture wound of left foot, initial encounter    Rx / DC Orders ED Discharge Orders         Ordered    oxyCODONE-acetaminophen (PERCOCET/ROXICET) 5-325 MG tablet  Every 6 hours PRN        07/01/20 2159    cephALEXin (KEFLEX) 500 MG capsule  3 times daily        07/01/20 2159           Lekia Nier, [place] N, PA-C 07/01/20 2206    Malvin Johns, MD 07/01/20 2236    Tarron Krolak, [place] N, PA-C 07/03/20 2146    Malvin Johns, MD 07/09/20 [phone]

## 2020-07-01 NOTE — ED Triage Notes (Addendum)
Pt states he fell- "piece of plastic" injured left ankle ~2 hours PTA-NAD-to triage in w/c

## 2020-07-01 NOTE — Discharge Instructions (Addendum)
Please read instructions below.  The Steri-Strips will fall off on their own.  You can gently clean the wound daily with soap and water, you may also soak it in warm soapy water. Avoid weightbearing on your left foot for at least 1 week or until cleared by orthopedics. Starting tomorrow, take the antibiotic, cephalexin, 3 times daily until gone. You can take ibuprofen/advil as needed for pain Follow up with the sports medicine specialist in about 1 week to ensure proper healing. Return to the ER for fever, pus draining from wound, redness, or new or worsening symptoms.

## 2020-07-05 ENCOUNTER — Ambulatory Visit
Admission: EM | Admit: 2020-07-05 | Discharge: 2020-07-05 | Disposition: A | Discharge status: Home / Self Care | Payer: 59

## 2020-07-05 DIAGNOSIS — M25572 Pain in left ankle and joints of left foot: Secondary | ICD-10-CM

## 2020-07-05 NOTE — ED Provider Notes (Signed)
[facility]    CSN: [id number] Arrival date & time: 07/05/20  1006      History   Chief Complaint Chief Complaint  Patient presents with  . Ankle Pain    HPI Carl Tanner is a 34 y.o. male  Presenting for wound check to left ankle.  Voicing concern for infection as there was "a lot of fluid that came out of it this morning ".  Denies fever, chills, arthralgias or myalgias.   Patient has been previously evaluated and treated in ER x2, specialist x1.  Please see those records in patient's chart.  Has been given prescriptions of opioids both in hospital and upon discharge, as well as antibiotics (Keflex).  Reports compliance with these.  Past Medical History:  Diagnosis Date  . Thyroid disease     There are no problems to display for this patient.   Past Surgical History:  Procedure Laterality Date  . ANKLE SURGERY    . APPENDECTOMY    . BRAIN SURGERY    . FASCIECTOMY    . HEMORROIDECTOMY    . THYROIDECTOMY         Home Medications    Prior to Admission medications   Medication Sig Start Date End Date Taking? Authorizing Provider  baclofen (LIORESAL) 10 MG tablet Take 10 mg by mouth 3 (three) times daily. 03/06/20   [provider]  BYSTOLIC 10 MG tablet Take 10 mg by mouth 2 (two) times daily. 06/23/20   [provider]  cephALEXin (KEFLEX) 500 MG capsule Take 1 capsule (500 mg total) by mouth 3 (three) times daily for 7 days. 07/01/20 07/08/20  Robinson, [place] N, PA-C  CORLANOR 5 MG TABS tablet Take 5 mg by mouth 2 (two) times daily. 05/13/20   [provider]  folic acid (FOLVITE) 1 MG tablet Take 1 mg by mouth daily. 03/16/20   [provider]  gabapentin (NEURONTIN) 400 MG capsule Take 400 mg by mouth 3 (three) times daily. 01/30/20   [provider]  HYDROmorphone (DILAUDID) 2 MG tablet SMARTSIG:3 By Mouth 4-5 Times Daily 06/22/20   [provider]  ibuprofen (ADVIL) 800 MG tablet Take 800 mg by mouth  3 (three) times daily. 06/22/20   [provider]  levothyroxine (SYNTHROID) 175 MCG tablet Take 175 mcg by mouth daily. 06/19/20   [provider]  methotrexate (RHEUMATREX) 2.5 MG tablet Take 15 mg by mouth once a week. 03/16/20   [provider]  metoprolol tartrate (LOPRESSOR) 25 MG tablet Take 25 mg by mouth daily. 06/17/20   [provider]  mirtazapine (REMERON) 15 MG tablet Take 15 mg by mouth at bedtime. 06/17/20   [provider]  omeprazole (PRILOSEC) 40 MG capsule Take 40 mg by mouth daily. 04/30/20   [provider]  ondansetron (ZOFRAN ODT) 4 MG disintegrating tablet Take 1 tablet (4 mg total) by mouth every 8 (eight) hours as needed for nausea or vomiting. 05/12/20   Kendrick, Caitlyn S, PA-C  oxybutynin (DITROPAN XL) 5 MG 24 hr tablet Take 1 tablet (5 mg total) by mouth at bedtime. 06/30/20   Dorie Rank, MD  oxyCODONE-acetaminophen (PERCOCET/ROXICET) 5-325 MG tablet Take 1 tablet by mouth every 6 (six) hours as needed for severe pain. 07/01/20   Robinson, [place] N, PA-C  phenazopyridine (PYRIDIUM) 200 MG tablet Take 1 tablet (200 mg total) by mouth 3 (three) times daily. 05/17/20   Carlisle Cater, PA-C  pregabalin (LYRICA) 75 MG capsule Take by mouth. 04/24/20  [provider]  VYVANSE 20 MG capsule Take 20 mg by mouth every morning. 05/21/20   [provider]    Family History Family History  Problem Relation Age of Onset  . Thyroid disease Mother     Social History Social History   Tobacco Use  . Smoking status: Never Smoker  . Smokeless tobacco: Never Used  Vaping Use  . Vaping Use: Every day  Substance Use Topics  . Alcohol use: Never  . Drug use: Never     Allergies   Benzodiazepines and Compazine [prochlorperazine]   Review of Systems As per HPI   Physical Exam Triage Vital Signs ED Triage Vitals  Enc Vitals Group     BP      Pulse      Resp      Temp      Temp src      SpO2      Weight       Height      Head Circumference      Peak Flow      Pain Score      Pain Loc      Pain Edu?      Excl. in [place]?    No data found.  Updated Vital Signs BP 120/80   Pulse 79   Temp 98.2 F (36.8 C) (Oral)   Resp 20   SpO2 98%   Visual Acuity Right Eye Distance:   Left Eye Distance:   Bilateral Distance:    Right Eye Near:   Left Eye Near:    Bilateral Near:     Physical Exam Constitutional:      General: He is not in acute distress. HENT:     Head: Normocephalic and atraumatic.  Eyes:     General: No scleral icterus.    Pupils: Pupils are equal, round, and reactive to light.  Cardiovascular:     Rate and Rhythm: Normal rate.  Pulmonary:     Effort: Pulmonary effort is normal. No respiratory distress.     Breath sounds: No wheezing.  Musculoskeletal:        General: Swelling and tenderness present.     Comments: Left ankle with superficial laceration that is transverse to Achilles.  No Steri-Strips intact: Patient states he fell off.  Skin:    Coloration: Skin is not jaundiced or pale.     Findings: No bruising, erythema or rash.  Neurological:     General: No focal deficit present.     Mental Status: He is alert and oriented to person, place, and time.      UC Treatments / Results  Labs (all labs ordered are listed, but only abnormal results are displayed) Labs Reviewed - No data to display  EKG   Radiology No results found.  Procedures Procedures (including critical care time)  Medications Ordered in UC Medications - No data to display  Initial Impression / Assessment and Plan / UC Course  I have reviewed the triage vital signs and the nursing notes.  Pertinent labs & imaging results that were available during my care of the patient were reviewed by me and considered in my medical decision making (see chart for details).     Patient afebrile, nontoxic.  No purulent or malodorous discharge.  Wound appears well and patient reports compliant  with Keflex.  No indication to change current treatment plan.  We will continue follow-up with orthopedics as scheduled.  Return precautions discussed, pt verbalized understanding  and is agreeable to plan. Final Clinical Impressions(s) / UC Diagnoses   Final diagnoses:  Acute left ankle pain   Discharge Instructions   None    ED Prescriptions    None     PDMP not reviewed this encounter.   Hall-Potvin, [place], [place] 07/05/20 1117

## 2020-07-05 NOTE — ED Triage Notes (Signed)
Pt here for evaluation of left ankle pain, swelling and drainage from wound that occurred 4 days ago. Seen in ER on day of injury and currently taking antibiotics. He is concerned with infection.

## 2020-08-29 DIAGNOSIS — F339 Major depressive disorder, recurrent, unspecified: Secondary | ICD-10-CM | POA: Insufficient documentation

## 2020-09-26 IMAGING — CT CT ABD-PELV W/ CM
2 of 4 series · 14 of 46 positions shown, 16 images · IV contrast (omnipaque)
Comparison: CT 04/22/2020

CLINICAL DATA: Acute nonlocalized abdominal pain with nausea for 5
days and emesis.

EXAM:
CT ABDOMEN AND PELVIS WITH CONTRAST
TECHNIQUE: Multidetector CT imaging of the abdomen and pelvis was performed
using the standard protocol following bolus administration of
intravenous contrast.
CONTRAST:  100mL OMNIPAQUE IOHEXOL 300 MG/ML  SOLN

[Series 2: axial st · axial · 0.81mm/px · z∈[-570,-150]mm · 11 of 102 slices shown, 13 images]
[im 9/102  soft-tissue]
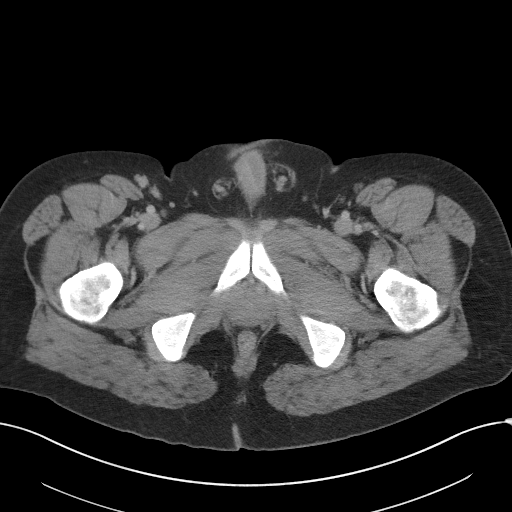
[im 9/102  bone]
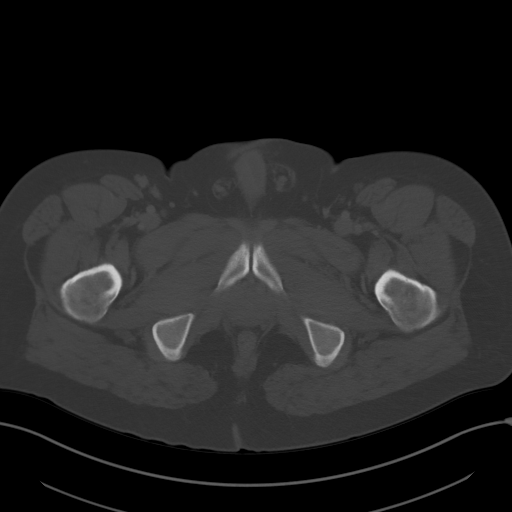
[im 17/102  soft-tissue]
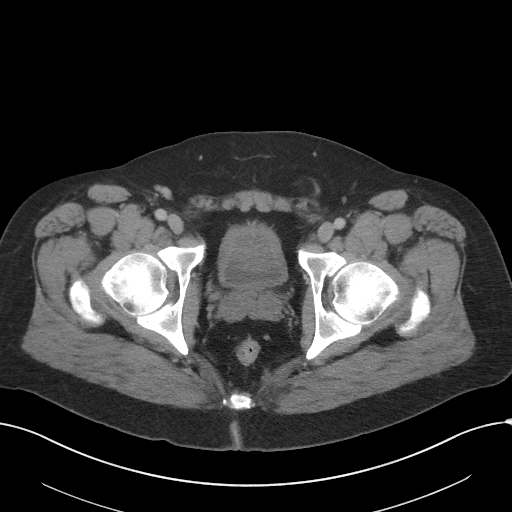
[im 25/102  soft-tissue]
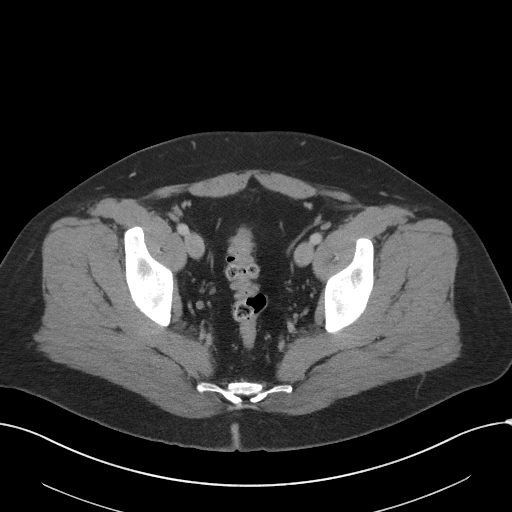
[im 33/102  soft-tissue]
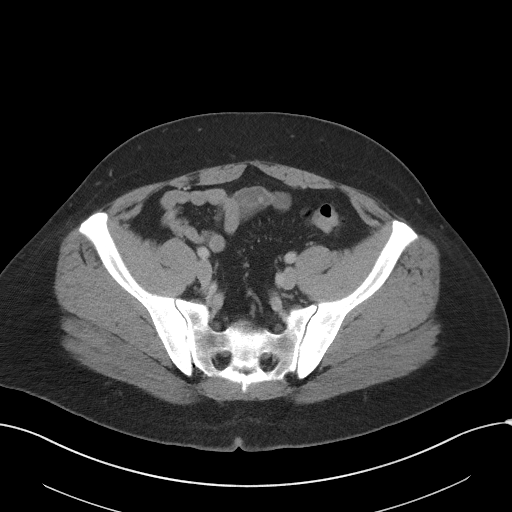
[im 41/102  soft-tissue]
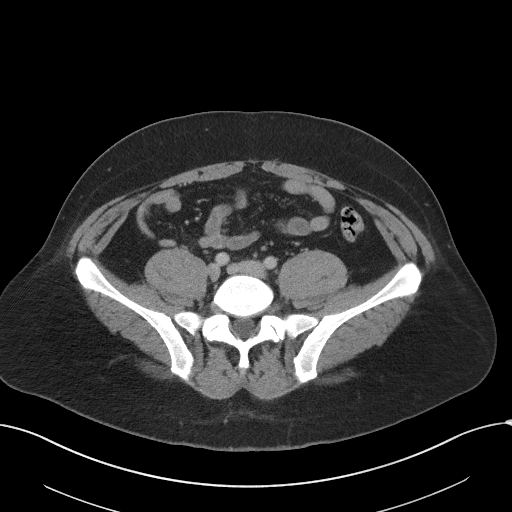
[im 53/102  soft-tissue]
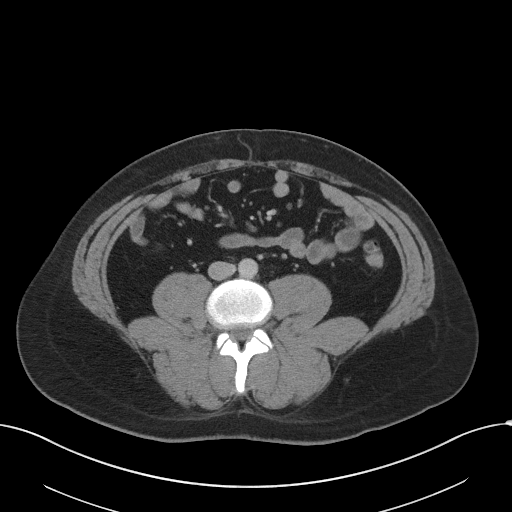
[im 61/102  soft-tissue]
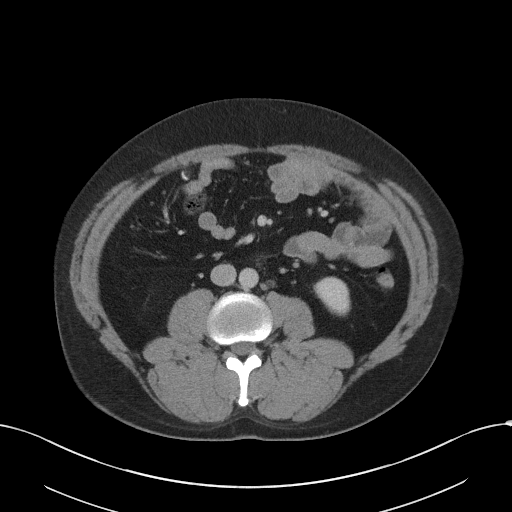
[im 69/102  soft-tissue]
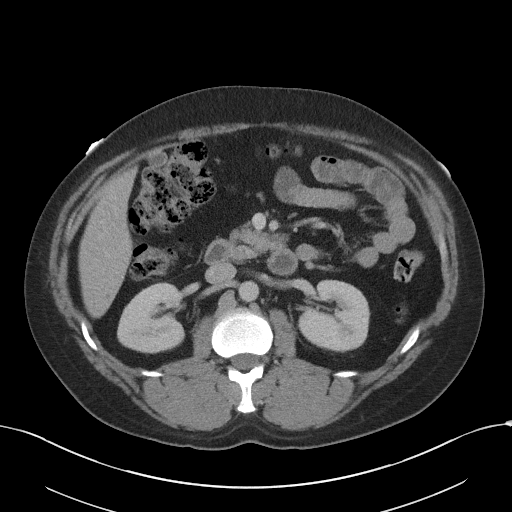
[im 77/102  soft-tissue]
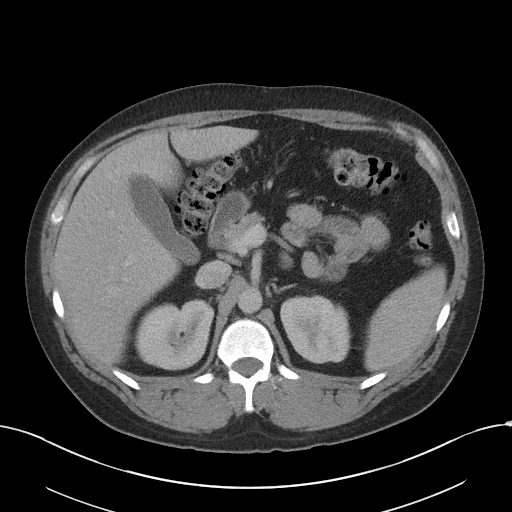
[im 77/102  bone]
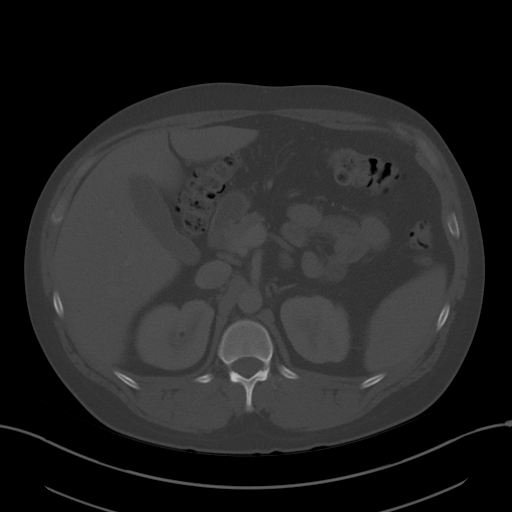
[im 85/102  soft-tissue]
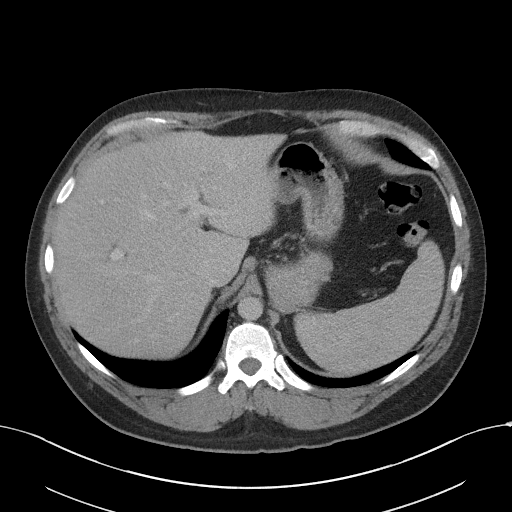
[im 93/102  soft-tissue]
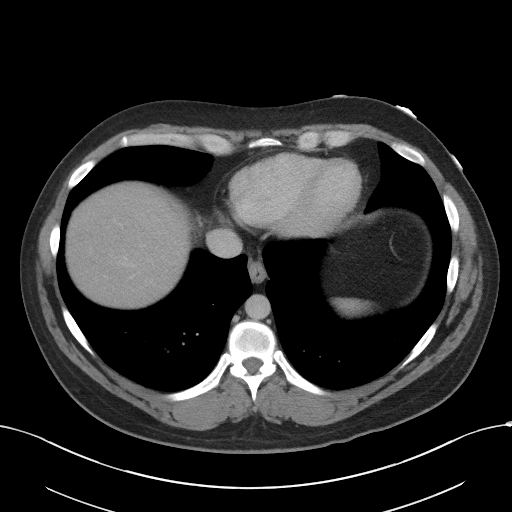

[Series 5: coronal st · coronal · 0.71mm/px · 3 of 97 slices shown]
[im 33/97  soft-tissue]
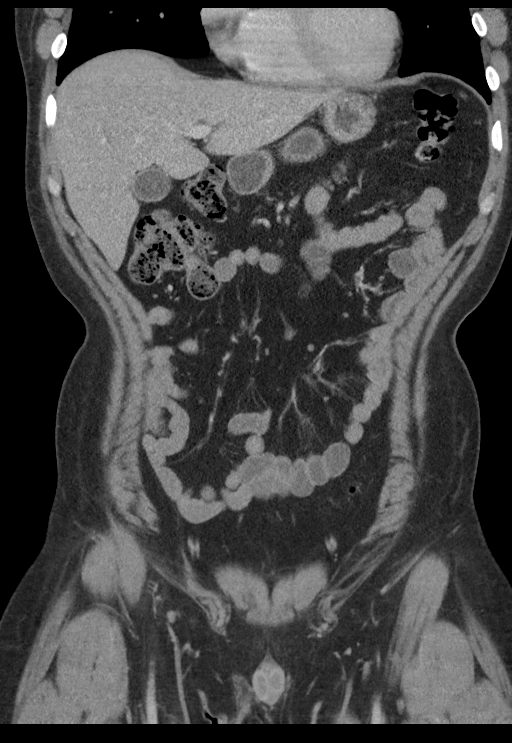
[im 43/97  soft-tissue]
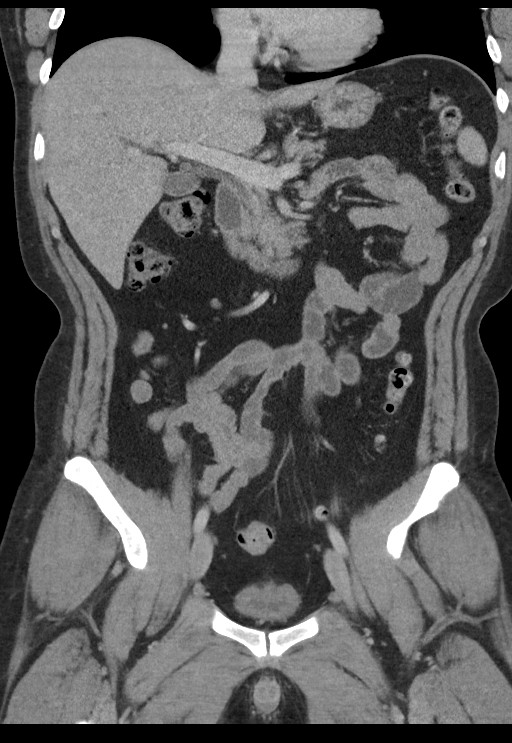
[im 54/97  soft-tissue]
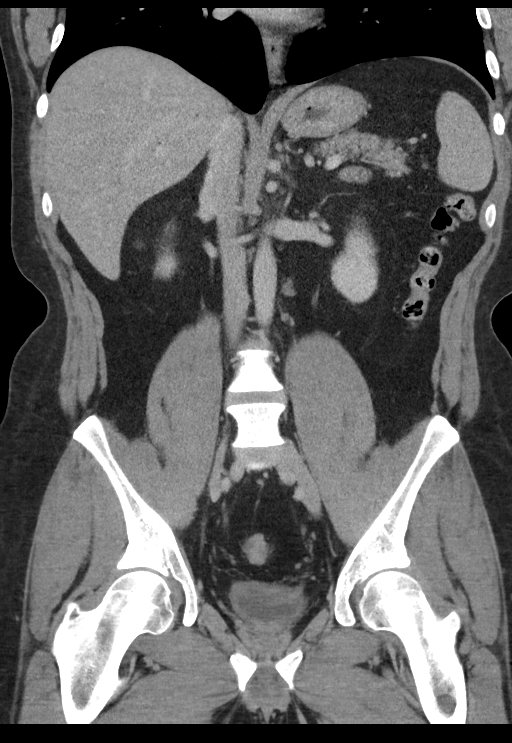

[14 of 46 positions shown; findings below may reference images not displayed]

FINDINGS: Lower chest: Lung bases are clear. Normal heart size. No pericardial
effusion.

Hepatobiliary: Few subcentimeter hypodense foci in the liver too
small to fully characterize on CT imaging but statistically likely
benign. No worrisome focal liver lesions. Smooth liver surface
contour. Normal hepatic attenuation. Small prominent fold towards
the gallbladder fundus. No visible calcified gallstones,
pericholecystic inflammation or biliary ductal dilatation.

Pancreas: Unremarkable. No pancreatic ductal dilatation or
surrounding inflammatory changes.

Spleen: Normal in size. No concerning splenic lesions.

Adrenals/Urinary Tract: Normal adrenal glands. Kidneys enhance
symmetrically and uniformly. No perinephric stranding. No concerning
renal mass. Few subcentimeter hypodense foci in the left interpolar
and lower pole kidney are incompletely characterized but likely
benign. No urolithiasis or hydronephrosis. Urinary bladder is
largely decompressed at the time of exam and therefore poorly
evaluated by CT imaging. Some prominent bladder wall thickening is
slightly greater than expected with faint perivesicular hazy
stranding as well. No visible bladder calculi or debris.

Stomach/Bowel: Distal esophagus, stomach and duodenal sweep are
unremarkable. No small bowel wall thickening or dilatation. No
evidence of obstruction. The appendix is surgically absent. Cecum
displaced slightly into the right upper quadrant. No evidence of
resulting obstruction/volvulus. No colonic dilatation or wall
thickening.

Vascular/Lymphatic: No significant vascular findings are present. No
enlarged abdominal or pelvic lymph nodes.

Reproductive: Tiny 7 mm hypoattenuating focus in the midline
prostate could reflect a small prosthetic utricle cyst or mullerian
duct cyst. Additional more posteroinferior calculation towards the
prosthetic apex is unchanged from prior and appears separate from
the expected course of the ureter. Seminal vesicles are
unremarkable.

Other: No abdominopelvic free fluid or free gas. No bowel containing
hernias. Small fat containing left inguinal hernia. Minimal
subcutaneous thickening in the right anterior abdominal wall may
reflect injectable use such as insulin.

Musculoskeletal: No acute osseous abnormality or suspicious osseous
lesion.
IMPRESSION: 1. Bladder is largely decompressed and poorly evaluated by CT
imaging however some prominent bladder wall thickening is slightly
greater than expected for underdistention with faint perivesicular
hazy stranding as well. Correlate with urinalysis to exclude
cystitis.
2. Tiny 7 mm hypoattenuating focus in the midline prostate could
reflect a small prosthetic utricle cyst or mullerian duct cyst.
Could consider outpatient urologic consultation.
3. Small fat containing left inguinal hernia.
4. Minimal subcutaneous thickening in the right anterior abdominal
wall may reflect injectable use such as insulin.

## 2020-11-10 ENCOUNTER — Other Ambulatory Visit: Payer: Self-pay

## 2020-11-10 ENCOUNTER — Encounter: Payer: Self-pay | Admitting: Urology

## 2020-11-10 ENCOUNTER — Ambulatory Visit (INDEPENDENT_AMBULATORY_CARE_PROVIDER_SITE_OTHER): Payer: 59 | Admitting: Urology

## 2020-11-10 VITALS — BP 114/7 | HR 88 | Ht 70.0 in | Wt 203.0 lb

## 2020-11-10 DIAGNOSIS — R300 Dysuria: Secondary | ICD-10-CM | POA: Diagnosis not present

## 2020-11-10 DIAGNOSIS — N411 Chronic prostatitis: Secondary | ICD-10-CM

## 2020-11-10 DIAGNOSIS — M6289 Other specified disorders of muscle: Secondary | ICD-10-CM | POA: Diagnosis not present

## 2020-11-10 LAB — BLADDER SCAN AMB NON-IMAGING: Scan Result: 171

## 2020-11-10 MED ORDER — MELOXICAM 7.5 MG PO TABS: 7.5000 mg | ORAL_TABLET | Freq: Every day | ORAL | 2 refills | Status: DC

## 2020-11-10 MED ORDER — TAMSULOSIN HCL 0.4 MG PO CAPS: 0.4000 mg | ORAL_CAPSULE | Freq: Every day | ORAL | 3 refills | Status: DC

## 2020-11-10 NOTE — Progress Notes (Signed)
11/10/2020 4:52 PM   Carl Tanner 1986/03/01 [id number]  Referring provider: No referring provider defined for this encounter.  Chief Complaint  Patient presents with  . Dysuria    HPI: 35 year old male who presents for further evaluation of urinary symptoms, perineal pelvic pain and testicular pain.  He reports today he had several months of waxing and waning urinary symptoms including frequency and occasional dysuria.  These come and go.  He has been seen on multiple occasions by urgent care and other providers for the same issue, had multiple urine tests all of which are negative.  He reports that when he has to urinate, he sometimes has trouble getting his stream started.  He is to try to volitionally relax do this.  His symptoms are exacerbated with anxiety.  In addition to this, he has developed pain in his perineum which radiates down to his testicles.  This comes and goes.  He is currently experiencing this at a more significant level.  He does report that he has multiple medical problems including personal history of rhabdomyolysis and compartment syndrome amongst others.  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  He also has chronic pain syndrome/fibromyalgia.  He has had multiple imaging studies including CT abdomen pelvis as recently as April 22, 2020 which showed no pathology.  He is also had an MRI of the lumbar spine on 05/2020 which was also unremarkable.  He is also been seen and evaluated by other urologist including at [facility] for erectile dysfunction and also the same issue.  He is not currently sexually active.   PMH: Past Medical History:  Diagnosis Date  . Thyroid disease     Surgical History: Past Surgical History:  Procedure Laterality Date  . ANKLE SURGERY    . APPENDECTOMY    . BRAIN SURGERY    . FASCIECTOMY    . HEMORROIDECTOMY    . THYROIDECTOMY      Home Medications:  Allergies as of 11/10/2020      Reactions   Benzodiazepines     Compazine [prochlorperazine]       Medication List       Accurate as of November 10, 2020  4:52 PM. If you have any questions, ask your nurse or doctor.        STOP taking these medications   baclofen 10 MG tablet Commonly known as: LIORESAL Stopped by: Hollice Espy, MD   Corlanor 5 MG Tabs tablet Generic drug: ivabradine Stopped by: Hollice Espy, MD   gabapentin 400 MG capsule Commonly known as: NEURONTIN Stopped by: Hollice Espy, MD   HYDROmorphone 2 MG tablet Commonly known as: DILAUDID Stopped by: Hollice Espy, MD   methotrexate 2.5 MG tablet Commonly known as: RHEUMATREX Stopped by: Hollice Espy, MD   oxybutynin 5 MG 24 hr tablet Commonly known as: Ditropan XL Stopped by: Hollice Espy, MD   oxyCODONE-acetaminophen 5-325 MG tablet Commonly known as: PERCOCET/ROXICET Stopped by: Hollice Espy, MD   phenazopyridine 200 MG tablet Commonly known as: PYRIDIUM Stopped by: Hollice Espy, MD   pregabalin 75 MG capsule Commonly known as: LYRICA Stopped by: Hollice Espy, MD   Vyvanse 20 MG capsule Generic drug: lisdexamfetamine Stopped by: Hollice Espy, MD     TAKE these medications   Bystolic 10 MG tablet Generic drug: nebivolol Take 10 mg by mouth 2 (two) times daily.   folic acid 1 MG tablet Commonly known as: FOLVITE Take 1 mg by mouth daily.   ibuprofen 800 MG tablet Commonly  known as: ADVIL Take 800 mg by mouth 3 (three) times daily.   levothyroxine 175 MCG tablet Commonly known as: SYNTHROID Take 175 mcg by mouth daily.   meloxicam 7.5 MG tablet Commonly known as: Mobic Take 1 tablet (7.5 mg total) by mouth daily. Started by: Hollice Espy, MD   metoprolol tartrate 25 MG tablet Commonly known as: LOPRESSOR Take 25 mg by mouth daily.   mirtazapine 15 MG tablet Commonly known as: REMERON Take 15 mg by mouth at bedtime.   omeprazole 40 MG capsule Commonly known as: PRILOSEC Take 40 mg by mouth daily.    ondansetron 4 MG disintegrating tablet Commonly known as: Zofran ODT Take 1 tablet (4 mg total) by mouth every 8 (eight) hours as needed for nausea or vomiting.   tamsulosin 0.4 MG Caps capsule Commonly known as: FLOMAX Take 1 capsule (0.4 mg total) by mouth daily. Started by: Hollice Espy, MD       Allergies:  Allergies  Allergen Reactions  . Benzodiazepines   . Compazine [Prochlorperazine]     Family History: Family History  Problem Relation Age of Onset  . Thyroid disease Mother     Social History:  reports that he has never smoked. He has never used smokeless tobacco. He reports that he does not drink alcohol and does not use drugs.   Physical Exam: BP (!) 114/7   Pulse 88   Ht 5\' 10"  (1.778 m)   Wt 203 lb (92.1 kg)   BMI 29.13 kg/m   Constitutional:  Alert and oriented, No acute distress. HEENT: [place] AT, moist mucus membranes.  Trachea midline, no masses. Cardiovascular: No clubbing, cyanosis, or edema. Respiratory: Normal respiratory effort, no increased work of breathing. GI: Abdomen is soft, nontender, nondistended, no abdominal masses GU: Normal phallus with orthotopic meatus without discharge.  Bilateral descended testicles, nontender, no masses or lesions. Rectal: Normal sphincter tone, small prostate, nontender Skin: No rashes, bruises or suspicious lesions. Neurologic: Grossly intact, no focal deficits, moving all 4 extremities. Psychiatric: Normal mood and affect.  Laboratory Data: Lab Results  Component Value Date   WBC 7.8 06/30/2020   HGB 15.6 06/30/2020   HCT 45.7 06/30/2020   MCV 82.6 06/30/2020   PLT 317 06/30/2020    Lab Results  Component Value Date   CREATININE 0.89 06/30/2020    Urinalysis Results for orders placed or performed in visit on 11/10/20  Microscopic Examination   Urine  Result Value Ref Range   WBC, UA 0-5 0 - 5 /hpf   RBC 0-2 0 - 2 /hpf   Epithelial Cells (non renal) None seen 0 - 10 /hpf   Bacteria, UA None  seen None seen/Few  Urinalysis, Complete  Result Value Ref Range   Specific Gravity, UA <1.005 (L) 1.005 - 1.030   pH, UA 5.5 5.0 - 7.5   Color, UA Yellow Yellow   Appearance Ur Clear Clear   Leukocytes,UA Negative Negative   Protein,UA Negative Negative/Trace   Glucose, UA Negative Negative   Ketones, UA Negative Negative   RBC, UA Negative Negative   Bilirubin, UA Negative Negative   Urobilinogen, Ur 0.2 0.2 - 1.0 mg/dL   Nitrite, UA Negative Negative   Microscopic Examination See below:   BLADDER SCAN AMB NON-IMAGING  Result Value Ref Range   Scan Result 171 ml     Assessment & Plan:    1. Dysuria Extensive evaluation with multiple previous urinalysis/urine culture including today, negative imaging  Strongly suspicious for #2 #3  is contributing factors  - Urinalysis, Complete - BLADDER SCAN AMB NON-IMAGING - Ambulatory referral to Physical Therapy  2. Pelvic floor dysfunction Slightly elevated postvoid residual today with history of dysuria, urinary hesitancy as well as frequency exacerbated by psychological stress and inability to relax  Above scenario strongly suspicious for pelvic floor dysfunction especially in this particular patient with his extensive medical history and no other recognizable GU pathology   He is open to being seen and evaluated by physical therapy, will make the appropriate referral  3. Chronic prostatitis There may be a component of chronic prostatitis thus will treat symptomatically with Flomax and NSAIDs for the next several weeks  Return if he fails to improve   Hollice Espy, MD  [facility] [address] [place], [place] [address] [phone]

## 2020-11-11 LAB — URINALYSIS, COMPLETE
Bilirubin, UA: NEGATIVE
Glucose, UA: NEGATIVE
Ketones, UA: NEGATIVE
Leukocytes,UA: NEGATIVE
Nitrite, UA: NEGATIVE
Protein,UA: NEGATIVE
RBC, UA: NEGATIVE
Specific Gravity, UA: <1.005 — ABNORMAL LOW (ref 1.005–1.030)
Urobilinogen, Ur: 0.2 mg/dL (ref 0.2–1.0)
pH, UA: 5.5 (ref 5.0–7.5)

## 2020-11-11 LAB — MICROSCOPIC EXAMINATION
Bacteria, UA: NONE SEEN
Epithelial Cells (non renal): NONE SEEN /hpf (ref 0–10)

## 2020-11-14 IMAGING — US US SCROTUM
1 series · 14 of 25 positions shown · non-contrast
Comparison: None.

CLINICAL DATA: Testicular pain

EXAM:
SCROTAL ULTRASOUND
DOPPLER ULTRASOUND OF THE TESTICLES
TECHNIQUE: Complete ultrasound examination of the testicles, epididymis, and
other scrotal structures was performed. Color and spectral Doppler
ultrasound were also utilized to evaluate blood flow to the
testicles.

[Series 1: us scrotum · 14 of 45 slices shown]
[im 1/45]
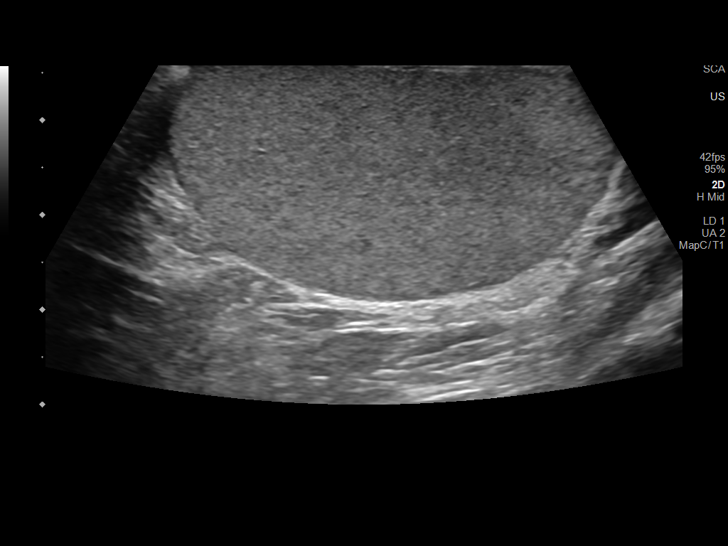
[im 4/45]
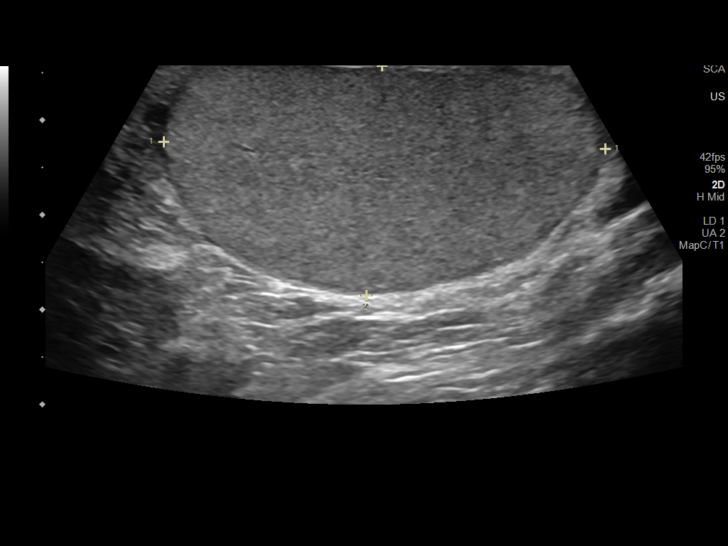
[im 8/45]
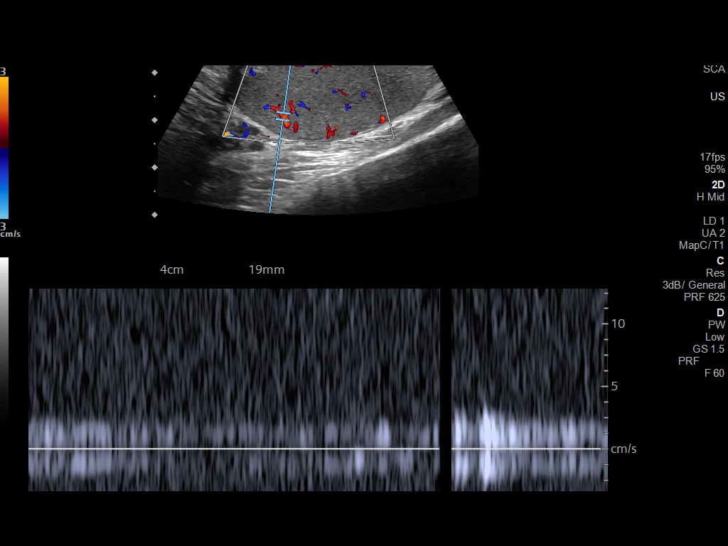
[im 12/45]
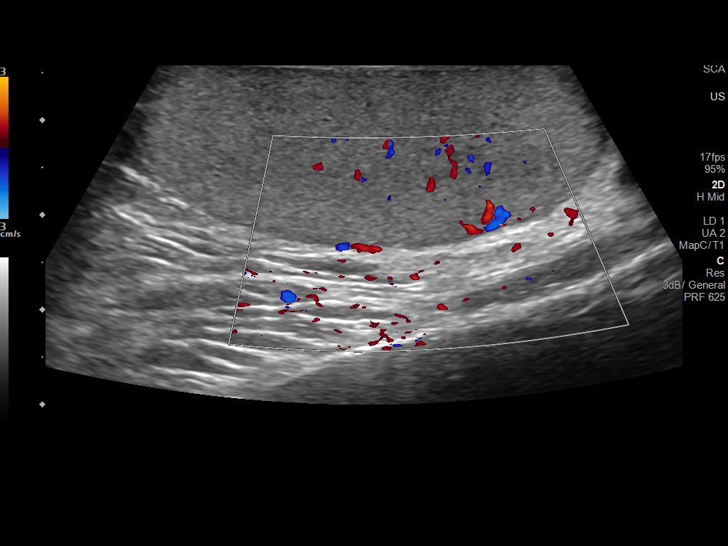
[im 15/45]
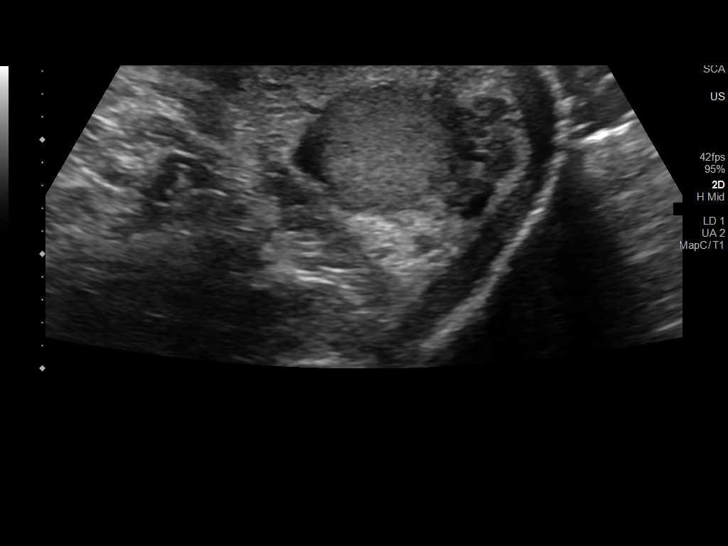
[im 17/45]
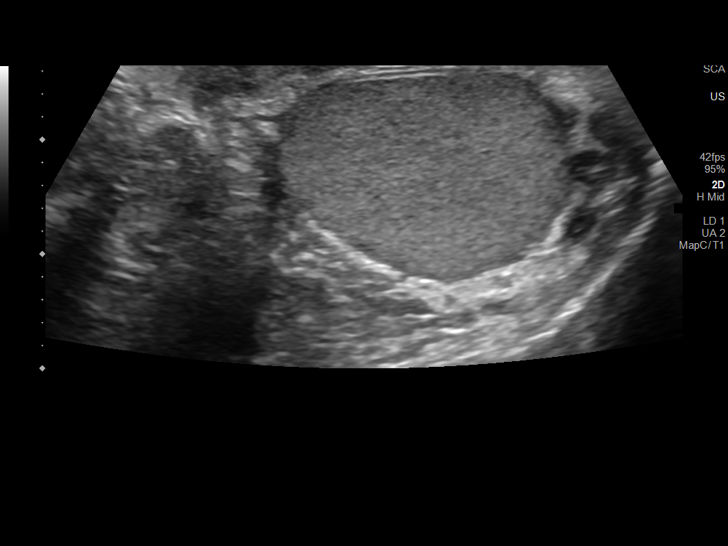
[im 21/45]
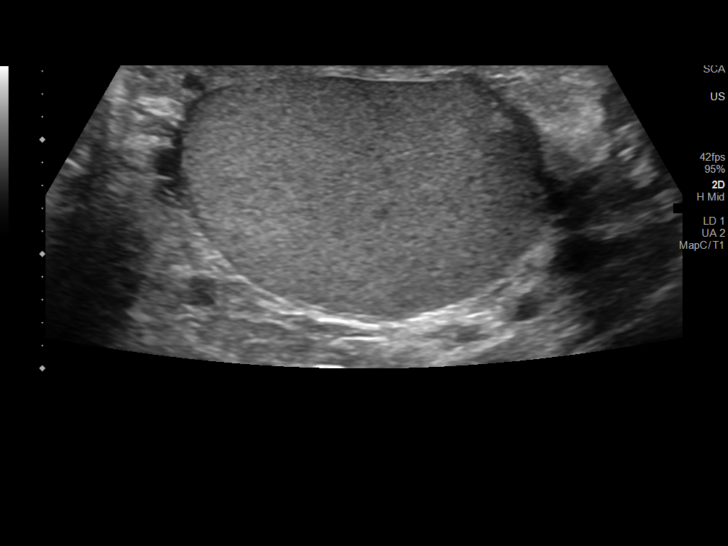
[im 24/45]
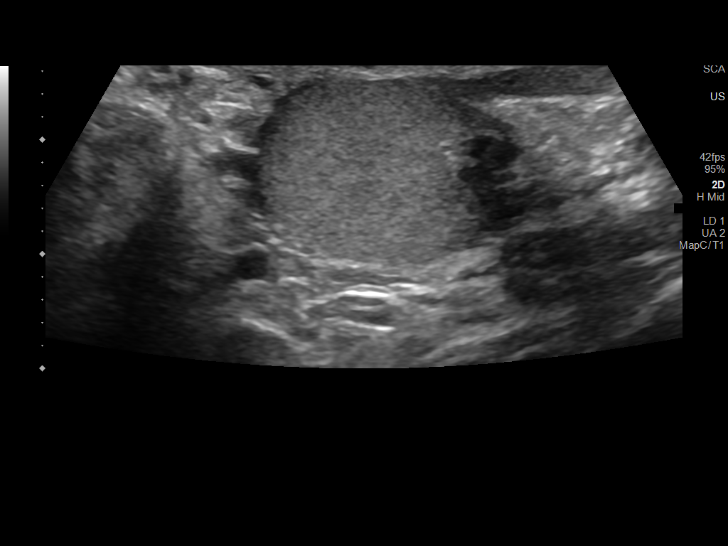
[im 28/45]
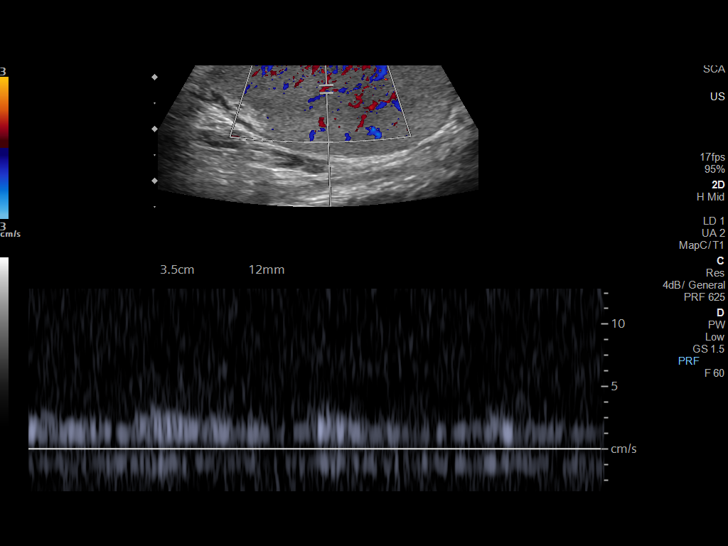
[im 30/45]
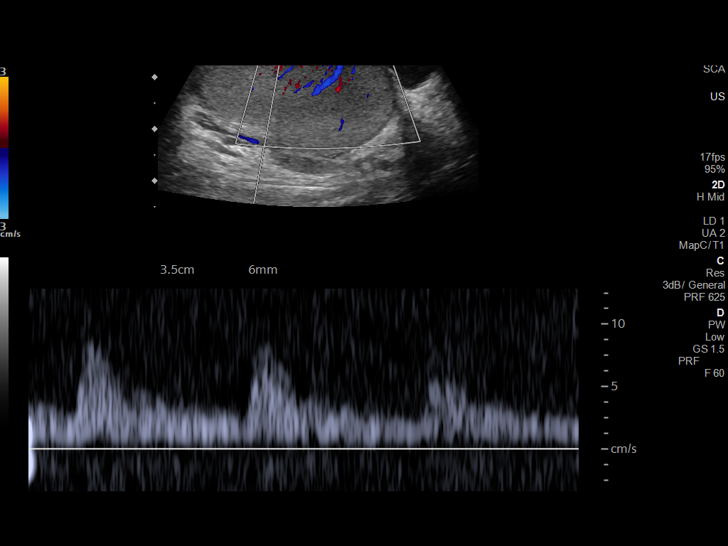
[im 34/45]
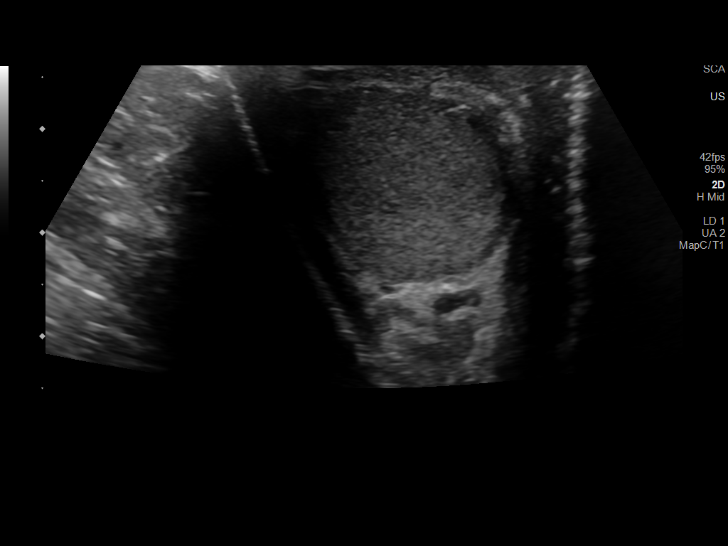
[im 37/45]
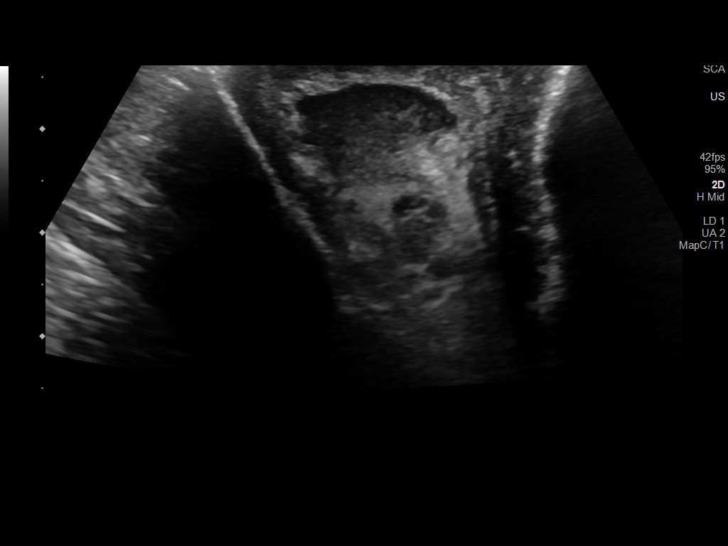
[im 41/45]
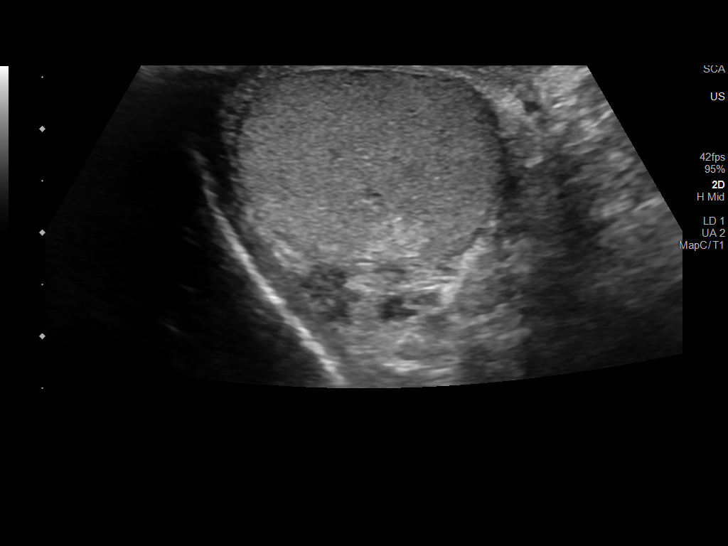
[im 45/45]
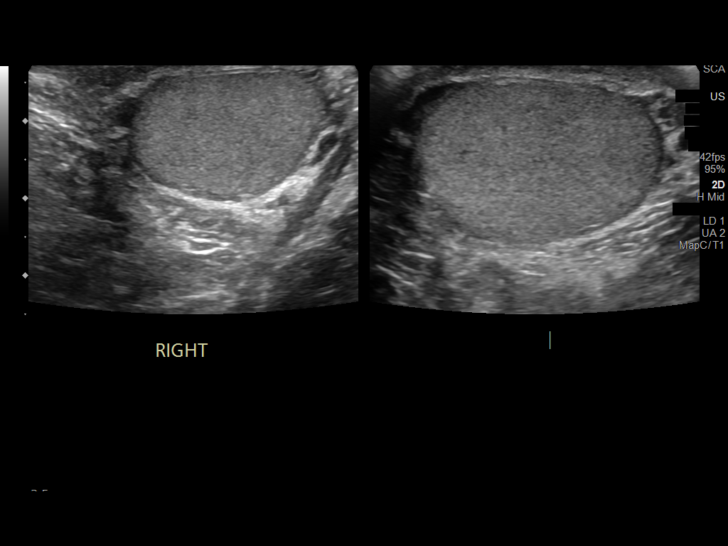

[14 of 25 positions shown; findings below may reference images not displayed]

FINDINGS: Right testicle

Measurements: 4.7 x 2.4 by 2.6 cm. No mass or microlithiasis
visualized. There is a single echogenic focus likely reflecting a
punctate calcification.

Left testicle

Measurements: 5.0 x 2.2 x 2.9 cm. No mass or microlithiasis
visualized.

Right epididymis:  Normal in size and appearance.

Left epididymis:  Normal in size and appearance.

Hydrocele:  None visualized.

Varicocele:  None visualized.

Pulsed Doppler interrogation of both testes demonstrates normal low
resistance arterial and venous waveforms bilaterally.
IMPRESSION: No sonographic etiology for testicular pain identified.

## 2021-02-05 ENCOUNTER — Encounter: Payer: Self-pay | Admitting: Urology

## 2021-02-09 ENCOUNTER — Ambulatory Visit: Payer: 59 | Admitting: Urology

## 2021-02-10 ENCOUNTER — Other Ambulatory Visit: Payer: Self-pay | Admitting: Urology

## 2021-02-10 DIAGNOSIS — R300 Dysuria: Secondary | ICD-10-CM

## 2021-02-10 DIAGNOSIS — M6289 Other specified disorders of muscle: Secondary | ICD-10-CM

## 2021-02-17 ENCOUNTER — Ambulatory Visit: Payer: 59 | Admitting: Urology

## 2021-02-17 NOTE — Progress Notes (Incomplete)
02/17/2021 8:38 AM   Stephanie Acre [birth year] [id number]  Referring provider: No referring provider defined for this encounter.  No chief complaint on file.   HPI: 35 year old male with probable pelvic floor dysfunction/chronic pelvic pain who returns today for 71-month follow-up.  Please see previous notes for details.  At last visit, he was referred to physical therapy for pelvic floor evaluation.  Unfortunately, this has yet to be scheduled.   PMH: Past Medical History:  Diagnosis Date  . Thyroid disease     Surgical History: Past Surgical History:  Procedure Laterality Date  . ANKLE SURGERY    . APPENDECTOMY    . BRAIN SURGERY    . FASCIECTOMY    . HEMORROIDECTOMY    . THYROIDECTOMY      Home Medications:  Allergies as of 02/17/2021      Reactions   Benzodiazepines    Compazine [prochlorperazine]       Medication List       Accurate as of Feb 17, 2021  8:38 AM. If you have any questions, ask your nurse or doctor.        Bystolic 10 MG tablet Generic drug: nebivolol Take 10 mg by mouth 2 (two) times daily.   folic acid 1 MG tablet Commonly known as: FOLVITE Take 1 mg by mouth daily.   ibuprofen 800 MG tablet Commonly known as: ADVIL Take 800 mg by mouth 3 (three) times daily.   levothyroxine 175 MCG tablet Commonly known as: SYNTHROID Take 175 mcg by mouth daily.   meloxicam 7.5 MG tablet Commonly known as: Mobic Take 1 tablet (7.5 mg total) by mouth daily.   metoprolol tartrate 25 MG tablet Commonly known as: LOPRESSOR Take 25 mg by mouth daily.   mirtazapine 15 MG tablet Commonly known as: REMERON Take 15 mg by mouth at bedtime.   omeprazole 40 MG capsule Commonly known as: PRILOSEC Take 40 mg by mouth daily.   ondansetron 4 MG disintegrating tablet Commonly known as: Zofran ODT Take 1 tablet (4 mg total) by mouth every 8 (eight) hours as needed for nausea or vomiting.   tamsulosin 0.4 MG Caps capsule Commonly known as:  FLOMAX Take 1 capsule (0.4 mg total) by mouth daily.       Allergies:  Allergies  Allergen Reactions  . Benzodiazepines   . Compazine [Prochlorperazine]     Family History: Family History  Problem Relation Age of Onset  . Thyroid disease Mother     Social History:  reports that he has never smoked. He has never used smokeless tobacco. He reports that he does not drink alcohol and does not use drugs.   Physical Exam: There were no vitals taken for this visit.  Constitutional:  Alert and oriented, No acute distress. HEENT: [place] AT, moist mucus membranes.  Trachea midline, no masses. Cardiovascular: No clubbing, cyanosis, or edema. Respiratory: Normal respiratory effort, no increased work of breathing. GI: Abdomen is soft, nontender, nondistended, no abdominal masses GU: No CVA tenderness Lymph: No cervical or inguinal lymphadenopathy. Skin: No rashes, bruises or suspicious lesions. Neurologic: Grossly intact, no focal deficits, moving all 4 extremities. Psychiatric: Normal mood and affect.  Laboratory Data: Lab Results  Component Value Date   WBC 7.8 06/30/2020   HGB 15.6 06/30/2020   HCT 45.7 06/30/2020   MCV 82.6 06/30/2020   PLT 317 06/30/2020    Lab Results  Component Value Date   CREATININE 0.89 06/30/2020    No results found for: PSA  No  results found for: TESTOSTERONE  No results found for: HGBA1C  Urinalysis    Component Value Date/Time   COLORURINE YELLOW 06/30/2020 1135   APPEARANCEUR Clear 11/10/2020 1456   LABSPEC 1.015 06/30/2020 1135   PHURINE 8.5 (H) 06/30/2020 1135   GLUCOSEU Negative 11/10/2020 1456   HGBUR NEGATIVE 06/30/2020 1135   BILIRUBINUR Negative 11/10/2020 1456   KETONESUR NEGATIVE 06/30/2020 1135   PROTEINUR Negative 11/10/2020 1456   PROTEINUR NEGATIVE 06/30/2020 1135   NITRITE Negative 11/10/2020 1456   NITRITE NEGATIVE 06/30/2020 1135   LEUKOCYTESUR Negative 11/10/2020 1456   LEUKOCYTESUR NEGATIVE 06/30/2020 1135     Lab Results  Component Value Date   LABMICR See below: 11/10/2020   WBCUA 0-5 11/10/2020   LABEPIT None seen 11/10/2020   BACTERIA None seen 11/10/2020    Pertinent Imaging: *** No results found for this or any previous visit.  No results found for this or any previous visit.  No results found for this or any previous visit.  No results found for this or any previous visit.  No results found for this or any previous visit.  No results found for this or any previous visit.  No results found for this or any previous visit.  No results found for this or any previous visit.   Assessment & Plan:    There are no diagnoses linked to this encounter.  No follow-ups on file.  Hollice Espy, MD  [facility] [address], [place], [place] [address] [phone]

## 2021-02-19 ENCOUNTER — Encounter: Payer: Self-pay | Admitting: Urology

## 2021-03-08 ENCOUNTER — Other Ambulatory Visit: Payer: Self-pay | Admitting: Urology

## 2021-03-08 DIAGNOSIS — R300 Dysuria: Secondary | ICD-10-CM

## 2021-03-08 DIAGNOSIS — M6289 Other specified disorders of muscle: Secondary | ICD-10-CM

## 2021-04-18 ENCOUNTER — Other Ambulatory Visit: Payer: Self-pay | Admitting: Urology

## 2021-04-18 DIAGNOSIS — M6289 Other specified disorders of muscle: Secondary | ICD-10-CM

## 2021-04-18 DIAGNOSIS — R300 Dysuria: Secondary | ICD-10-CM

## 2021-04-22 NOTE — Telephone Encounter (Signed)
Ok to fill 

## 2021-06-19 ENCOUNTER — Encounter: Payer: Self-pay | Admitting: Urology

## 2021-06-29 NOTE — Progress Notes (Incomplete)
06/29/21 1:51 PM   Carl Tanner 04/11/1986 [id number]  Referring provider:  No referring provider defined for this encounter. No chief complaint on file.    HPI: Carl Tanner is a 35 y.o.male with a personal history of prostatitis, who presents today for further evaluation of low testosterone.   He was recently seen by [facility] where he recently had blood work. His total testosterone was on the low end at 102. His LH was also extremely low at 0.2   He has had multiple imaging studies including CT abdomen pelvis the most recent was April 22, 2020 which showed no pathology.  He also had an MRI of the lumbar spine on 05/2020 which was also unremarkable.   He has also been seen and evaluated by other urologist including at [facility] for ED.   PMH: Past Medical History:  Diagnosis Date   Thyroid disease     Surgical History: Past Surgical History:  Procedure Laterality Date   ANKLE SURGERY     APPENDECTOMY     BRAIN SURGERY     FASCIECTOMY     HEMORROIDECTOMY     THYROIDECTOMY      Home Medications:  Allergies as of 06/30/2021       Reactions   Benzodiazepines    Compazine [prochlorperazine]         Medication List        Accurate as of June 29, 2021  1:51 PM. If you have any questions, ask your nurse or doctor.          Bystolic 10 MG tablet Generic drug: nebivolol Take 10 mg by mouth 2 (two) times daily.   folic acid 1 MG tablet Commonly known as: FOLVITE Take 1 mg by mouth daily.   ibuprofen 800 MG tablet Commonly known as: ADVIL Take 800 mg by mouth 3 (three) times daily.   levothyroxine 175 MCG tablet Commonly known as: SYNTHROID Take 175 mcg by mouth daily.   meloxicam 7.5 MG tablet Commonly known as: Mobic Take 1 tablet (7.5 mg total) by mouth daily.   metoprolol tartrate 25 MG tablet Commonly known as: LOPRESSOR Take 25 mg by mouth daily.   mirtazapine 15 MG tablet Commonly known as: REMERON Take 15 mg by mouth  at bedtime.   omeprazole 40 MG capsule Commonly known as: PRILOSEC Take 40 mg by mouth daily.   ondansetron 4 MG disintegrating tablet Commonly known as: Zofran ODT Take 1 tablet (4 mg total) by mouth every 8 (eight) hours as needed for nausea or vomiting.   tamsulosin 0.4 MG Caps capsule Commonly known as: FLOMAX TAKE 1 CAPSULE BY MOUTH EVERY DAY        Allergies:  Allergies  Allergen Reactions   Benzodiazepines    Compazine [Prochlorperazine]     Family History: Family History  Problem Relation Age of Onset   Thyroid disease Mother     Social History:  reports that he has never smoked. He has never used smokeless tobacco. He reports that he does not drink alcohol and does not use drugs.   Physical Exam: There were no vitals taken for this visit.  Constitutional:  Alert and oriented, No acute distress. HEENT: [place] AT, moist mucus membranes.  Trachea midline, no masses. Cardiovascular: No clubbing, cyanosis, or edema. Respiratory: Normal respiratory effort, no increased work of breathing. Skin: No rashes, bruises or suspicious lesions. Neurologic: Grossly intact, no focal deficits, moving all 4 extremities. Psychiatric: Normal mood and affect.  Laboratory Data:  Lab Results  Component Value Date   CREATININE 0.89 06/30/2020     Urinalysis   Pertinent Imaging:    Assessment & Plan:     No follow-ups on file.  I,Kailey Littlejohn,acting as a Education administrator for Hollice Espy, MD.,have documented all relevant documentation on the behalf of Hollice Espy, MD,as directed by  Hollice Espy, MD while in the presence of Hollice Espy, [place] [address] [place], [place] [address] [phone]

## 2021-06-30 ENCOUNTER — Ambulatory Visit: Payer: 59 | Admitting: Urology

## 2021-07-14 ENCOUNTER — Ambulatory Visit: Payer: 59 | Admitting: Urology

## 2021-07-16 ENCOUNTER — Encounter: Payer: Self-pay | Admitting: Urology

## 2021-10-05 ENCOUNTER — Ambulatory Visit: Payer: 59 | Admitting: Family Medicine

## 2022-05-03 ENCOUNTER — Ambulatory Visit (HOSPITAL_COMMUNITY): Payer: Self-pay

## 2022-05-17 ENCOUNTER — Ambulatory Visit
Admission: RE | Admit: 2022-05-17 | Discharge: 2022-05-17 | Disposition: A | Discharge status: Home / Self Care | Payer: BC Managed Care – PPO | Source: Ambulatory Visit | Attending: Family Medicine | Admitting: Family Medicine

## 2022-05-17 VITALS — BP 113/77 | HR 71 | Temp 97.9°F | Resp 18

## 2022-05-17 DIAGNOSIS — E039 Hypothyroidism, unspecified: Secondary | ICD-10-CM | POA: Insufficient documentation

## 2022-05-17 DIAGNOSIS — R350 Frequency of micturition: Secondary | ICD-10-CM | POA: Diagnosis present

## 2022-05-17 DIAGNOSIS — R252 Cramp and spasm: Secondary | ICD-10-CM | POA: Insufficient documentation

## 2022-05-17 LAB — POCT URINALYSIS DIP (MANUAL ENTRY)
Bilirubin, UA: NEGATIVE
Blood, UA: NEGATIVE
Glucose, UA: NEGATIVE mg/dL
Ketones, POC UA: NEGATIVE mg/dL
Leukocytes, UA: NEGATIVE
Nitrite, UA: NEGATIVE
Protein Ur, POC: NEGATIVE mg/dL
Spec Grav, UA: 1.02 (ref 1.010–1.025)
Urobilinogen, UA: 0.2 E.U./dL
pH, UA: 6.5 (ref 5.0–8.0)

## 2022-05-17 MED ORDER — IBUPROFEN 800 MG PO TABS: 800.0000 mg | ORAL_TABLET | Freq: Three times a day (TID) | ORAL | 0 refills | Status: AC | PRN

## 2022-05-17 MED ORDER — KETOROLAC TROMETHAMINE 30 MG/ML IJ SOLN
30.0000 mg | Freq: Once | INTRAMUSCULAR | Status: AC
  Administered 2022-05-17: 30 mg via INTRAMUSCULAR

## 2022-05-17 MED ORDER — CIPROFLOXACIN HCL 500 MG PO TABS: 500.0000 mg | ORAL_TABLET | Freq: Two times a day (BID) | ORAL | 0 refills | Status: AC

## 2022-05-17 MED ORDER — TAMSULOSIN HCL 0.4 MG PO CAPS: 0.4000 mg | ORAL_CAPSULE | Freq: Every day | ORAL | 2 refills | Status: AC

## 2022-05-17 MED ORDER — LEVOTHYROXINE SODIUM 200 MCG PO TABS: 200.0000 ug | ORAL_TABLET | Freq: Every day | ORAL | 2 refills | Status: AC

## 2022-05-17 NOTE — ED Triage Notes (Signed)
The pt c/o urinary frequency, and lower back pain.   Started: 3-4 days ago   Thyroid medication: levothyroxine 212mg

## 2022-05-17 NOTE — ED Provider Notes (Addendum)
[facility]    CSN: [id number] Arrival date & time: 05/17/22  1312      History   Chief Complaint Chief Complaint  Patient presents with   Urinary Frequency    Entered by patient    HPI Carl Tanner is a 36 y.o. male.    Urinary Frequency   Here for urinary frequency and urge.  He is also had some incontinence at night recently in the last couple of months.  In the last 3 or 4 days, he has had significant bilateral low back pain.  The pain is not affected by movement.  It does kind of radiate into his inguinal areas.  No definite fever or chills noted.  No nausea, vomiting, or diarrhea.  He has noted some leg cramps  A few months ago he was diagnosed with a renal stone, and he has not been able to get in with a urologist.  He is on Bystolic for hypertension per his cardiologist  He is out of his thyroid medication, and he does not have a PCP.  He would like to see an endocrinologist  Past Medical History:  Diagnosis Date   Thyroid disease     There are no problems to display for this patient.   Past Surgical History:  Procedure Laterality Date   ANKLE SURGERY     APPENDECTOMY     BRAIN SURGERY     FASCIECTOMY     HEMORROIDECTOMY     THYROIDECTOMY         Home Medications    Prior to Admission medications   Medication Sig Start Date End Date Taking? Authorizing Provider  ciprofloxacin (CIPRO) 500 MG tablet Take 1 tablet (500 mg total) by mouth 2 (two) times daily for 7 days. 05/17/22 05/24/22 Yes Jermiah Howton, Gwenlyn Perking, MD  DULoxetine (CYMBALTA) 60 MG capsule Take 60 mg by mouth daily.   Yes [provider]  hydroxychloroquine (PLAQUENIL) 200 MG tablet Take by mouth daily.   Yes [provider]  ibuprofen (ADVIL) 800 MG tablet Take 1 tablet (800 mg total) by mouth every 8 (eight) hours as needed (pain). 05/17/22  Yes Barrett Henle, MD  tamsulosin (FLOMAX) 0.4 MG CAPS capsule Take 1 capsule (0.4 mg total) by mouth daily. 05/17/22  Yes  Eleanore Junio, Gwenlyn Perking, MD  BYSTOLIC 10 MG tablet Take 10 mg by mouth 2 (two) times daily. 06/23/20   [provider]  folic acid (FOLVITE) 1 MG tablet Take 1 mg by mouth daily. 03/16/20   [provider]  levothyroxine (SYNTHROID) 200 MCG tablet Take 1 tablet (200 mcg total) by mouth daily. 05/17/22   Barrett Henle, MD  omeprazole (PRILOSEC) 40 MG capsule Take 40 mg by mouth daily. 04/30/20   [provider]    Family History Family History  Problem Relation Age of Onset   Thyroid disease Mother     Social History Social History   Tobacco Use   Smoking status: Never   Smokeless tobacco: Never  Vaping Use   Vaping Use: Every day  Substance Use Topics   Alcohol use: Never   Drug use: Never     Allergies   Benzodiazepines and Compazine [prochlorperazine]   Review of Systems Review of Systems  Genitourinary:  Positive for frequency.     Physical Exam Triage Vital Signs ED Triage Vitals  Enc Vitals Group     BP 05/17/22 1344 113/77     Pulse Rate 05/17/22 1344 71  Resp 05/17/22 1344 18     Temp 05/17/22 1344 97.9 F (36.6 C)     Temp Source 05/17/22 1344 Oral     SpO2 05/17/22 1344 97 %     Weight --      Height --      Head Circumference --      Peak Flow --      Pain Score 05/17/22 1341 7     Pain Loc --      Pain Edu? --      Excl. in [place]? --    No data found.  Updated Vital Signs BP 113/77 (BP Location: Left Arm)   Pulse 71   Temp 97.9 F (36.6 C) (Oral)   Resp 18   SpO2 97%   Visual Acuity Right Eye Distance:   Left Eye Distance:   Bilateral Distance:    Right Eye Near:   Left Eye Near:    Bilateral Near:     Physical Exam Vitals reviewed.  Constitutional:      General: He is not in acute distress.    Appearance: He is not ill-appearing, toxic-appearing or diaphoretic.  HENT:     Mouth/Throat:     Mouth: Mucous membranes are moist.  Eyes:     Extraocular Movements: Extraocular movements intact.     Pupils:  Pupils are equal, round, and reactive to light.  Cardiovascular:     Rate and Rhythm: Normal rate and regular rhythm.     Heart sounds: No murmur heard. Pulmonary:     Effort: Pulmonary effort is normal.     Breath sounds: Normal breath sounds.  Abdominal:     Palpations: Abdomen is soft.     Tenderness: There is no abdominal tenderness. There is no right CVA tenderness or left CVA tenderness.  Musculoskeletal:        General: No tenderness.     Cervical back: Neck supple.  Lymphadenopathy:     Cervical: No cervical adenopathy.  Skin:    Coloration: Skin is not jaundiced or pale.  Neurological:     General: No focal deficit present.     Mental Status: He is alert and oriented to person, place, and time.  Psychiatric:        Behavior: Behavior normal.      UC Treatments / Results  Labs (all labs ordered are listed, but only abnormal results are displayed) Labs Reviewed  URINE CULTURE  BASIC METABOLIC PANEL  POCT URINALYSIS DIP (MANUAL ENTRY)    EKG   Radiology No results found.  Procedures Procedures (including critical care time)  Medications Ordered in UC Medications  ketorolac (TORADOL) 30 MG/ML injection 30 mg (has no administration in time range)    Initial Impression / Assessment and Plan / UC Course  I have reviewed the triage vital signs and the nursing notes.  Pertinent labs & imaging results that were available during my care of the patient were reviewed by me and considered in my medical decision making (see chart for details).     Will treat with flomax for probable stone, and cover with cipro for 7 days in case there is urinary infection. Culture sent.  Contact information given for urology.  He is shown how to self schedule an appointment for PCP.  Levothyroxine refill sent   Final Clinical Impressions(s) / UC Diagnoses   Final diagnoses:  Urinary frequency  Acquired hypothyroidism  Leg cramps     Discharge Instructions  Urinalysis was clear.  Urine culture has been sent.  You have been given a shot of Toradol 30 mg today.  Take ibuprofen 800 mg--1 tab every 8 hours as needed for pain.  Take Cipro 500 mg--1 tablet 2 times daily for 7 days  Continue your levothyroxine  Tamsulosin 0.4 mg--1 daily.  This is to help urinary flow  You can use the QR code/website at the back of the summary paperwork to schedule yourself a new patient appointment with primary care  Staff will notify you if there is anything significantly abnormal on your blood work we did today, which we will check sodium and potassium and sugar     ED Prescriptions     Medication Sig Dispense Auth. Provider   levothyroxine (SYNTHROID) 200 MCG tablet Take 1 tablet (200 mcg total) by mouth daily. 30 tablet Terina Mcelhinny, Gwenlyn Perking, MD   tamsulosin (FLOMAX) 0.4 MG CAPS capsule Take 1 capsule (0.4 mg total) by mouth daily. 30 capsule Barrett Henle, MD   ciprofloxacin (CIPRO) 500 MG tablet Take 1 tablet (500 mg total) by mouth 2 (two) times daily for 7 days. 14 tablet Rickeya Manus, Gwenlyn Perking, MD   ibuprofen (ADVIL) 800 MG tablet Take 1 tablet (800 mg total) by mouth every 8 (eight) hours as needed (pain). 21 tablet Lorain Keast, Gwenlyn Perking, MD      I have reviewed the PDMP during this encounter.   Barrett Henle, MD 05/17/22 1410    Barrett Henle, MD 05/17/22 [phone]

## 2022-05-17 NOTE — Discharge Instructions (Addendum)
Urinalysis was clear.  Urine culture has been sent.  You have been given a shot of Toradol 30 mg today.  Take ibuprofen 800 mg--1 tab every 8 hours as needed for pain.  Take Cipro 500 mg--1 tablet 2 times daily for 7 days  Continue your levothyroxine  Tamsulosin 0.4 mg--1 daily.  This is to help urinary flow  You can use the QR code/website at the back of the summary paperwork to schedule yourself a new patient appointment with primary care  Staff will notify you if there is anything significantly abnormal on your blood work we did today, which we will check sodium and potassium and sugar

## 2022-05-18 LAB — BASIC METABOLIC PANEL
BUN/Creatinine Ratio: 16 (ref 9–20)
BUN: 13 mg/dL (ref 6–20)
CO2: 23 mmol/L (ref 20–29)
Calcium: 10.4 mg/dL — ABNORMAL HIGH (ref 8.7–10.2)
Chloride: 98 mmol/L (ref 96–106)
Creatinine, Ser: 0.81 mg/dL (ref 0.76–1.27)
Glucose: 109 mg/dL — ABNORMAL HIGH (ref 70–99)
Potassium: 4.4 mmol/L (ref 3.5–5.2)
Sodium: 140 mmol/L (ref 134–144)
eGFR: 119 mL/min/{1.73_m2} (ref >59)

## 2022-05-18 LAB — URINE CULTURE: Culture: NO GROWTH

## 2022-07-04 ENCOUNTER — Ambulatory Visit: Payer: BC Managed Care – PPO | Admitting: Family Medicine

## 2022-07-05 ENCOUNTER — Ambulatory Visit: Payer: BC Managed Care – PPO | Admitting: Family Medicine

## 2022-07-05 ENCOUNTER — Telehealth: Payer: Self-pay | Admitting: General Practice

## 2022-07-05 DIAGNOSIS — K635 Polyp of colon: Secondary | ICD-10-CM | POA: Insufficient documentation

## 2022-07-05 NOTE — Telephone Encounter (Signed)
Pt was a no show for a NP app with Dr. Gena Fray on 07/05/22, I sent a letter.

## 2022-07-06 NOTE — Telephone Encounter (Signed)
1st no show, fee waived, letter sent 

## 2022-07-21 ENCOUNTER — Ambulatory Visit: Payer: BC Managed Care – PPO | Admitting: Family Medicine

## 2022-07-21 ENCOUNTER — Telehealth: Payer: Self-pay | Admitting: Family Medicine

## 2022-07-21 NOTE — Telephone Encounter (Signed)
Pt was a no show 10/12 for a New Patient visit with Dr. Grandville Silos. He claims to have mixed up his appts. This would be his second, he is already rescheduled

## 2022-07-25 ENCOUNTER — Ambulatory Visit: Payer: BC Managed Care – PPO | Admitting: Gastroenterology

## 2022-07-25 ENCOUNTER — Ambulatory Visit: Payer: BC Managed Care – PPO | Admitting: Family Medicine

## 2022-07-25 ENCOUNTER — Telehealth: Payer: Self-pay | Admitting: Family Medicine

## 2022-07-25 NOTE — Telephone Encounter (Signed)
Pt was a no show 10/16 for a New Patient visit with Dr. Grandville Silos. This is his second

## 2022-07-27 ENCOUNTER — Encounter: Payer: Self-pay | Admitting: Family Medicine

## 2022-07-27 NOTE — Telephone Encounter (Signed)
Fee generated for no show, pt dismissed due to 3 no showed NP appointments.

## 2022-08-02 NOTE — Telephone Encounter (Signed)
Pt dismissed due to multiple missed new patient visits

## 2022-11-08 ENCOUNTER — Other Ambulatory Visit: Payer: Self-pay

## 2022-11-08 ENCOUNTER — Ambulatory Visit: Payer: BC Managed Care – PPO | Admitting: Gastroenterology

## 2022-11-08 ENCOUNTER — Encounter: Payer: Self-pay | Admitting: Gastroenterology

## 2022-11-08 NOTE — Progress Notes (Deleted)
Jonathon Bellows MD, MRCP(U.K) [address]  [place]  [place], [place] [address]  Main: [phone]  Fax: [phone]   Gastroenterology Consultation  Referring Provider:     No ref. provider found Primary Care Physician:  Patient, No Pcp Per Primary Gastroenterologist:  Dr. Jonathon Bellows  Reason for Consultation:     ***        HPI:   Carl Tanner is a 37 y.o. y/o male referred for consultation & management  by Dr. Patient, No Pcp Per.  ***  Past Medical History:  Diagnosis Date   Thyroid disease     Past Surgical History:  Procedure Laterality Date   ANKLE SURGERY     APPENDECTOMY     BRAIN SURGERY     FASCIECTOMY     HEMORROIDECTOMY     THYROIDECTOMY      Prior to Admission medications   Medication Sig Start Date End Date Taking? Authorizing Provider  BYSTOLIC 10 MG tablet Take 10 mg by mouth 2 (two) times daily. 06/23/20   [provider]  DULoxetine (CYMBALTA) 60 MG capsule Take 60 mg by mouth daily.    [provider]  folic acid (FOLVITE) 1 MG tablet Take 1 mg by mouth daily. 03/16/20   [provider]  hydroxychloroquine (PLAQUENIL) 200 MG tablet Take by mouth daily.    [provider]  ibuprofen (ADVIL) 800 MG tablet Take 1 tablet (800 mg total) by mouth every 8 (eight) hours as needed (pain). 05/17/22   Barrett Henle, MD  levothyroxine (SYNTHROID) 200 MCG tablet Take 1 tablet (200 mcg total) by mouth daily. 05/17/22   Barrett Henle, MD  omeprazole (PRILOSEC) 40 MG capsule Take 40 mg by mouth daily. 04/30/20   [provider]  tamsulosin (FLOMAX) 0.4 MG CAPS capsule Take 1 capsule (0.4 mg total) by mouth daily. 05/17/22   Barrett Henle, MD    Family History  Problem Relation Age of Onset   Thyroid disease Mother      Social History   Tobacco Use   Smoking status: Never   Smokeless tobacco: Never  Vaping Use   Vaping Use: Every day  Substance Use Topics   Alcohol use: Never   Drug use: Never     Allergies as of 11/08/2022 - Review Complete 05/17/2022  Allergen Reaction Noted   Benzodiazepines Other (See Comments), Anxiety, Itching, Rash, and Swelling 09/30/2016   Prochlorperazine Other (See Comments), Hives, and Rash 09/28/2016   Prochlorperazine edisylate Other (See Comments) 09/28/2016    Review of Systems:    All systems reviewed and negative except where noted in HPI.   Physical Exam:  There were no vitals taken for this visit. No LMP for male patient. Psych:  Alert and cooperative. Normal mood and affect. General:   Alert,  Well-developed, well-nourished, pleasant and cooperative in NAD Head:  Normocephalic and atraumatic. Eyes:  Sclera clear, no icterus.   Conjunctiva pink. Ears:  Normal auditory acuity. Neck:  Supple; no masses or thyromegaly. Lungs:  Respirations even and unlabored.  Clear throughout to auscultation.   No wheezes, crackles, or rhonchi. No acute distress. Heart:  Regular rate and rhythm; no murmurs, clicks, rubs, or gallops. Abdomen:  Normal bowel sounds.  No bruits.  Soft, non-tender and non-distended without masses, hepatosplenomegaly or hernias noted.  No guarding or rebound tenderness.    Neurologic:  Alert and oriented x3;  grossly normal neurologically. Psych:  Alert and cooperative. Normal mood and affect.  Imaging Studies: No results found.  Assessment and Plan:   Carl Tanner is a 37 y.o. y/o male has been referred for ***  Follow up in ***  Dr Jonathon Bellows MD,MRCP(U.K)

## 2023-01-23 ENCOUNTER — Encounter: Payer: Self-pay | Admitting: *Deleted

## 2023-12-03 ENCOUNTER — Encounter (HOSPITAL_BASED_OUTPATIENT_CLINIC_OR_DEPARTMENT_OTHER): Payer: Self-pay | Admitting: Emergency Medicine

## 2023-12-03 ENCOUNTER — Ambulatory Visit (HOSPITAL_BASED_OUTPATIENT_CLINIC_OR_DEPARTMENT_OTHER)
Admission: EM | Admit: 2023-12-03 | Discharge: 2023-12-03 | Disposition: A | Discharge status: Home / Self Care | Payer: BC Managed Care – PPO | Attending: Family Medicine | Admitting: Family Medicine

## 2023-12-03 DIAGNOSIS — R051 Acute cough: Secondary | ICD-10-CM | POA: Diagnosis not present

## 2023-12-03 DIAGNOSIS — J040 Acute laryngitis: Secondary | ICD-10-CM | POA: Insufficient documentation

## 2023-12-03 DIAGNOSIS — H9201 Otalgia, right ear: Secondary | ICD-10-CM | POA: Diagnosis present

## 2023-12-03 DIAGNOSIS — J029 Acute pharyngitis, unspecified: Secondary | ICD-10-CM | POA: Diagnosis present

## 2023-12-03 DIAGNOSIS — R070 Pain in throat: Secondary | ICD-10-CM | POA: Insufficient documentation

## 2023-12-03 LAB — POC COVID19/FLU A&B COMBO
Covid Antigen, POC: NEGATIVE
Influenza A Antigen, POC: NEGATIVE
Influenza B Antigen, POC: NEGATIVE

## 2023-12-03 LAB — POCT RAPID STREP A (OFFICE): Rapid Strep A Screen: NEGATIVE

## 2023-12-03 NOTE — ED Provider Notes (Signed)
 [facility]    CSN: [id number] Arrival date & time: 12/03/23  1315      History   Chief Complaint Chief Complaint  Patient presents with   Sore Throat    HPI Carl Tanner is a 38 y.o. male.   Patient reports new onset of severe throat pain and right ear pain on Friday (12/01/23), which he counted as 3 days but it is 48 hours.  He has bodyaches, mild cough and loss of voice.  Patient has been taken Coricidin.   Sore Throat Pertinent negatives include no chest pain and no abdominal pain.    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 Authorizing Provider  BYSTOLIC 10 MG tablet Take 10 mg by mouth 2 (two) times daily. 06/23/20  Yes [provider]  DULoxetine (CYMBALTA) 60 MG capsule Take 60 mg by mouth daily.   Yes [provider]  ibuprofen (ADVIL) 800 MG tablet Take 1 tablet (800 mg total) by mouth every 8 (eight) hours as needed (pain). 05/17/22   Yes Zenia Resides, MD  levothyroxine (SYNTHROID) 200 MCG tablet Take 1 tablet (200 mcg total) by mouth daily. 05/17/22  Yes Zenia Resides, MD  omeprazole (PRILOSEC) 40 MG capsule Take 40 mg by mouth daily. 04/30/20  Yes [provider]  folic acid (FOLVITE) 1 MG tablet Take 1 mg by mouth daily. 03/16/20   [provider]  hydroxychloroquine (PLAQUENIL) 200 MG tablet Take by mouth daily.    [provider]  tamsulosin (FLOMAX) 0.4 MG CAPS capsule Take 1 capsule (0.4 mg total) by mouth daily. 05/17/22   Zenia Resides, MD    Family History Family History  Problem Relation Age of Onset   Thyroid disease Mother     Social History Social History   Tobacco Use   Smoking status: Never   Smokeless tobacco: Never  Vaping Use   Vaping status: Some Days  Substance Use Topics   Alcohol use: Never   Drug use: Never     Allergies   Benzodiazepines, Prochlorperazine, and Prochlorperazine edisylate   Review of Systems Review of Systems  Constitutional:  Negative for chills and fever.  HENT:  Positive for ear pain, sore throat and voice change.   Eyes:  Negative for pain and visual disturbance.  Respiratory:  Positive for cough.   Cardiovascular:  Negative for chest pain and palpitations.  Gastrointestinal:  Negative for abdominal pain, constipation,  diarrhea, nausea and vomiting.  Genitourinary:  Negative for dysuria and hematuria.  Musculoskeletal:  Positive for arthralgias. Negative for back pain.  Skin:  Negative for color change and rash.  Neurological:  Negative for seizures and syncope.  All other systems reviewed and are negative.    Physical Exam Triage Vital Signs ED Triage Vitals  Encounter Vitals Group     BP 12/03/23 1451 107/71     Systolic BP Percentile --      Diastolic BP Percentile --      Pulse Rate 12/03/23 1451 69     Resp 12/03/23 1451 18     Temp 12/03/23 1451 98.5 F (36.9 C)     Temp Source 12/03/23 1451 Oral      SpO2 12/03/23 1451 98 %     Weight 12/03/23 1452 220 lb (99.8 kg)     Height 12/03/23 1452 5\' 10"  (1.778 m)     Head Circumference --      Peak Flow --      Pain Score 12/03/23 1452 8     Pain Loc --      Pain Education --      Exclude from Growth Chart --    No data found.  Updated Vital Signs BP 107/71 (BP Location: Right Arm)   Pulse 69   Temp 98.5 F (36.9 C) (Oral)   Resp 18   Ht 5\' 10"  (1.778 m)   Wt 220 lb (99.8 kg)   SpO2 98%   BMI 31.57 kg/m   Visual Acuity Right Eye Distance:   Left Eye Distance:   Bilateral Distance:    Right Eye Near:   Left Eye Near:    Bilateral Near:     Physical Exam Vitals and nursing note reviewed.  Constitutional:      General: He is not in acute distress.    Appearance: He is well-developed. He is ill-appearing. He is not toxic-appearing.  HENT:     Head: Normocephalic and atraumatic.     Right Ear: Hearing, tympanic membrane, ear canal and external ear normal.     Left Ear: Hearing, tympanic membrane, ear canal and external ear normal.     Nose: Congestion and rhinorrhea present. Rhinorrhea is clear.     Right Sinus: No maxillary sinus tenderness or frontal sinus tenderness.     Left Sinus: No maxillary sinus tenderness or frontal sinus tenderness.     Mouth/Throat:     Lips: Pink.     Mouth: Mucous membranes are moist.     Pharynx: Uvula midline. No oropharyngeal exudate or posterior oropharyngeal erythema.     Tonsils: No tonsillar exudate.     Comments: Hoarse voice Eyes:     Conjunctiva/sclera: Conjunctivae normal.     Pupils: Pupils are equal, round, and reactive to light.  Cardiovascular:     Rate and Rhythm: Normal rate and regular rhythm.     Heart sounds: S1 normal and S2 normal. No murmur heard. Pulmonary:     Effort: Pulmonary effort is normal. No respiratory distress.     Breath sounds: Normal breath sounds. No decreased breath sounds, wheezing, rhonchi or rales.  Abdominal:     General: Bowel sounds are  normal.     Palpations: Abdomen is soft.     Tenderness: There is no abdominal tenderness.  Musculoskeletal:        General: No swelling.     Cervical back: Neck supple.  Lymphadenopathy:     Head:  Right side of head: No submental, submandibular, tonsillar, preauricular or posterior auricular adenopathy.     Left side of head: No submental, submandibular, tonsillar, preauricular or posterior auricular adenopathy.     Cervical: No cervical adenopathy.     Right cervical: No superficial cervical adenopathy.    Left cervical: No superficial cervical adenopathy.  Skin:    General: Skin is warm and dry.     Capillary Refill: Capillary refill takes less than 2 seconds.     Findings: No rash.  Neurological:     Mental Status: He is alert and oriented to person, place, and time.  Psychiatric:        Mood and Affect: Mood normal.      UC Treatments / Results  Labs (all labs ordered are listed, but only abnormal results are displayed) Labs Reviewed  POC COVID19/FLU A&B COMBO - Normal  POCT RAPID STREP A (OFFICE) - Normal  CULTURE, GROUP A STREP [facility])    EKG   Radiology No results found.  Procedures Procedures (including critical care time)  Medications Ordered in UC Medications - No data to display  Initial Impression / Assessment and Plan / UC Course  I have reviewed the triage vital signs and the nursing notes.  Pertinent labs & imaging results that were available during my care of the patient were reviewed by me and considered in my medical decision making (see chart for details).     Rapid strep, flu, COVID all negative.  Throat culture sent.  Will adjust the plan of care if needed, once the culture results.  Use acetaminophen and ibuprofen, as directed on the package, as needed for throat pain or fever.  Regarding laryngitis, not speaking is the very best therapy.  Work excuse provided.  Follow-up if symptoms do not improve, worsen or new symptoms occur. Final  Clinical Impressions(s) / UC Diagnoses   Final diagnoses:  Sore throat  Acute cough  Laryngitis     Discharge Instructions      Rapid strep, flu, COVID all negative.  Throat culture sent.  Will adjust the plan of care, if needed once the culture results.  Advised about comfort measures.  Use acetaminophen or ibuprofen as directed on the package, as needed for fever or throat pain.  Regarding laryngitis, not speaking at all is the best plan.  Follow-up if symptoms do not improve, worsen or new symptoms occur.  Work excuse provided.     ED Prescriptions   None    PDMP not reviewed this encounter.   Prescilla Sours, FNP 12/03/23 1530

## 2023-12-03 NOTE — ED Triage Notes (Signed)
 Patient c/o severe sore throat and right ear pain x 3 days.  Just doesn't feel well.  Patient has been taken Coricidin.

## 2023-12-03 NOTE — Discharge Instructions (Signed)
 Rapid strep, flu, COVID all negative.  Throat culture sent.  Will adjust the plan of care, if needed once the culture results.  Advised about comfort measures.  Use acetaminophen or ibuprofen as directed on the package, as needed for fever or throat pain.  Regarding laryngitis, not speaking at all is the best plan.  Follow-up if symptoms do not improve, worsen or new symptoms occur.  Work excuse provided.

## 2023-12-04 ENCOUNTER — Ambulatory Visit: Payer: Self-pay

## 2023-12-06 LAB — CULTURE, GROUP A STREP (THRC)

## 2023-12-06 NOTE — Progress Notes (Signed)
 Throat culture is negative.  Patient was advised that if the culture was negative, it would be available on the portal but we would not call him with his results.  No change in care plan.  Follow-up as needed.

## 2024-09-02 ENCOUNTER — Ambulatory Visit: Admitting: Radiology

## 2024-09-02 ENCOUNTER — Other Ambulatory Visit: Payer: Self-pay

## 2024-09-02 ENCOUNTER — Ambulatory Visit
Admission: EM | Admit: 2024-09-02 | Discharge: 2024-09-02 | Disposition: A | Discharge status: Home / Self Care | Attending: Physician Assistant | Admitting: Physician Assistant

## 2024-09-02 ENCOUNTER — Emergency Department (HOSPITAL_BASED_OUTPATIENT_CLINIC_OR_DEPARTMENT_OTHER)
Admission: EM | Admit: 2024-09-02 | Discharge: 2024-09-03 | Discharge status: Against Medical Advice | Attending: Emergency Medicine | Admitting: Emergency Medicine

## 2024-09-02 DIAGNOSIS — M5441 Lumbago with sciatica, right side: Secondary | ICD-10-CM | POA: Insufficient documentation

## 2024-09-02 DIAGNOSIS — M5442 Lumbago with sciatica, left side: Secondary | ICD-10-CM | POA: Insufficient documentation

## 2024-09-02 DIAGNOSIS — M545 Low back pain, unspecified: Secondary | ICD-10-CM | POA: Diagnosis present

## 2024-09-02 LAB — BASIC METABOLIC PANEL WITH GFR
Anion gap: 12 (ref 5–15)
BUN: 10 mg/dL (ref 6–20)
CO2: 26 mmol/L (ref 22–32)
Calcium: 9.5 mg/dL (ref 8.9–10.3)
Chloride: 99 mmol/L (ref 98–111)
Creatinine, Ser: 0.95 mg/dL (ref 0.61–1.24)
GFR, Estimated: >60 mL/min (ref >60)
Glucose, Bld: 139 mg/dL — ABNORMAL HIGH (ref 70–99)
Potassium: 3.8 mmol/L (ref 3.5–5.1)
Sodium: 137 mmol/L (ref 135–145)

## 2024-09-02 LAB — URINALYSIS, ROUTINE W REFLEX MICROSCOPIC
Bacteria, UA: NONE SEEN
Bilirubin Urine: NEGATIVE
Glucose, UA: NEGATIVE mg/dL
Hgb urine dipstick: NEGATIVE
Nitrite: NEGATIVE
Protein, ur: NEGATIVE mg/dL
Specific Gravity, Urine: 1.022 (ref 1.005–1.030)
pH: 6.5 (ref 5.0–8.0)

## 2024-09-02 LAB — POCT URINE DIPSTICK
Bilirubin, UA: NEGATIVE
Glucose, UA: NEGATIVE mg/dL
Ketones, POC UA: NEGATIVE mg/dL
Leukocytes, UA: NEGATIVE
Nitrite, UA: NEGATIVE
POC PROTEIN,UA: NEGATIVE
Spec Grav, UA: >=1.03 — AB (ref 1.010–1.025)
Urobilinogen, UA: 0.2 U/dL
pH, UA: 5.5 (ref 5.0–8.0)

## 2024-09-02 LAB — CBC
HCT: 49.3 % (ref 39.0–52.0)
Hemoglobin: 15.8 g/dL (ref 13.0–17.0)
MCH: 25.2 pg — ABNORMAL LOW (ref 26.0–34.0)
MCHC: 32 g/dL (ref 30.0–36.0)
MCV: 78.5 fL — ABNORMAL LOW (ref 80.0–100.0)
Platelets: 340 K/uL (ref 150–400)
RBC: 6.28 MIL/uL — ABNORMAL HIGH (ref 4.22–5.81)
RDW: 14.6 % (ref 11.5–15.5)
WBC: 7.6 K/uL (ref 4.0–10.5)
nRBC: 0 % (ref 0.0–0.2)

## 2024-09-02 NOTE — ED Notes (Signed)
 Patient is being discharged from the Urgent Care and sent to the Emergency Department via private vehicle (refused EMS transport). Per Rocky Mecum PA, patient is in need of higher level of care due to severe lower back pain. Patient is aware and verbalizes understanding of plan of care.   Vitals:   09/02/24 1600  BP: 129/74  Pulse: 78  Resp: 16  Temp: 97.7 F (36.5 C)  SpO2: 97%

## 2024-09-02 NOTE — ED Triage Notes (Signed)
 Pt presents with a chief complaint of lower back pain x 2 days. Currently rates overall pain an 8/10. Describes as a throbbing, stabbing sensation. OTC Motrin  + Tylenol  taken for symptoms with no improvement. Last BM was this morning. When he strained, pain was intense. No recent injuries. Colonoscopy performed last week.

## 2024-09-02 NOTE — ED Triage Notes (Addendum)
 Lower back pain 2 days. Throbbing, stabbing. Last BM this AM-painful. Seen at UC earlier- blood detected in urine. Pt has hx renal stones but this feels very different, much lower. Denies dysuria, endorses seeing blood in his urine.   Colonoscopy last week.

## 2024-09-02 NOTE — ED Provider Notes (Signed)
 [facility]    CSN: [id number] Arrival date & time: 09/02/24  1419      History   Chief Complaint Chief Complaint  Patient presents with   Back Pain    HPI Carl Tanner is a 38 y.o. male.  has a past medical history of Thyroid disease.   HPI  Discussed the use of AI scribe software for clinical note transcription with the patient, who gave verbal consent to proceed.  The patient, with a history of chronic back pain, presents with acute lower back pain radiating to the buttocks and legs.  He has been experiencing acute lower back pain since Saturday night, which has progressively worsened. The pain is localized at the base of his back and radiates into the buttocks and legs. It is described as severe, making it difficult to find a comfortable position for sleep and causing him to feel 'stuck on the toilet' due to leg weakness. The pain is exacerbated by certain sitting positions, standing for prolonged periods, and bending. Over-the-counter medications such as ibuprofen  and Tylenol  have not provided relief.  He denies any recent injuries, falls, or heavy lifting that could have precipitated the pain. He has a history of back issues. No fever, chills, or blood in stool. He mentions discomfort during bowel movements, particularly when pushing, and reports intermittent blood in his urine.  He has also experienced intermittent blood in his urine and is scheduled for a cystoscopy next month. He has been evaluated for kidney stones in the past, which he describes as feeling different from his current symptoms.  He has a history of immune system issues and is in the process of being diagnosed with lupus, having previously been treated with methotrexate.     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 Authorizing Provider  BYSTOLIC 10 MG tablet Take 10 mg by mouth 2 (two) times daily. 06/23/20   [provider]  DULoxetine (CYMBALTA) 60 MG capsule Take 60 mg by mouth daily.    [provider]  folic acid (FOLVITE) 1 MG tablet Take 1 mg by mouth daily. 03/16/20   [provider]  hydroxychloroquine (PLAQUENIL) 200 MG tablet Take by mouth daily.    [provider]  ibuprofen  (ADVIL ) 800 MG tablet Take 1 tablet (800 mg total) by mouth every 8 (eight) hours as needed (pain). 05/17/22   Vonna Sharlet POUR, MD  levothyroxine  (SYNTHROID ) 200 MCG tablet Take 1 tablet (  200 mcg total) by mouth daily. 05/17/22   Banister, Pamela K, MD  omeprazole (PRILOSEC) 40 MG capsule Take 40 mg by mouth daily. 04/30/20   [provider]  tamsulosin  (FLOMAX ) 0.4 MG CAPS capsule Take 1 capsule (0.4 mg total) by mouth daily. 05/17/22   Vonna Sharlet POUR, MD    Family History Family History  Problem Relation Age of Onset   Thyroid disease Mother     Social History Social History   Tobacco Use   Smoking status: Never   Smokeless tobacco: Never   Vaping Use   Vaping status: Some Days  Substance Use Topics   Alcohol use: Never   Drug use: Never     Allergies   Benzodiazepines, Prochlorperazine, and Prochlorperazine edisylate   Review of Systems Review of Systems  Constitutional:  Negative for chills and fever.  Gastrointestinal:  Negative for blood in stool.       Pain with bowel movements   Genitourinary:  Negative for dysuria.  Musculoskeletal:  Positive for back pain.     Physical Exam Triage Vital Signs ED Triage Vitals  Encounter Vitals Group     BP 09/02/24 1600 129/74     Girls Systolic BP Percentile --      Girls Diastolic BP Percentile --      Boys Systolic BP Percentile --      Boys Diastolic BP Percentile --      Pulse Rate 09/02/24 1600 78     Resp 09/02/24 1600 16     Temp 09/02/24 1600 97.7 F (36.5 C)     Temp Source 09/02/24 1600 Oral     SpO2 09/02/24 1600 97 %     Weight 09/02/24 1612 232 lb (105.2 kg)     Height 09/02/24 1612 5' 10 (1.778 m)     Head Circumference --      Peak Flow --      Pain Score 09/02/24 1612 8     Pain Loc --      Pain Education --      Exclude from Growth Chart --    No data found.  Updated Vital Signs BP 129/74 (BP Location: Right Arm)   Pulse 78   Temp 97.7 F (36.5 C) (Oral)   Resp 16   Ht 5' 10 (1.778 m)   Wt 232 lb (105.2 kg)   SpO2 97%   BMI 33.29 kg/m   Visual Acuity Right Eye Distance:   Left Eye Distance:   Bilateral Distance:    Right Eye Near:   Left Eye Near:    Bilateral Near:     Physical Exam Vitals reviewed.  Constitutional:      General: He is awake.     Appearance: Normal appearance. He is well-developed and well-groomed.  HENT:     Head: Normocephalic and atraumatic.  Eyes:     Extraocular Movements: Extraocular movements intact.     Conjunctiva/sclera: Conjunctivae normal.  Pulmonary:     Effort: Pulmonary effort is normal.  Musculoskeletal:     Cervical back: Normal range of motion.     Thoracic back:  Tenderness and bony tenderness present.     Lumbar back: Spasms, tenderness and bony tenderness present. Decreased range of motion.     Comments: Pt has notable midline tenderness of the lumbar spine as well as paraspinal tenderness.  He also has pain along the upper buttocks as well as SI joint tenderness bilaterally.  Neurological:     Mental Status:  He is alert and oriented to person, place, and time.  Psychiatric:        Attention and Perception: Attention normal.        Mood and Affect: Mood normal.        Speech: Speech normal.        Behavior: Behavior normal. Behavior is cooperative.        Thought Content: Thought content normal.        Judgment: Judgment normal.      UC Treatments / Results  Labs (all labs ordered are listed, but only abnormal results are displayed) Labs Reviewed  POCT URINE DIPSTICK - Abnormal; Notable for the following components:      Result Value   Spec Grav, UA >=1.030 (*)    Blood, UA small (*)    All other components within normal limits    EKG   Radiology   DG Lumbar Spine Complete Result Date: 09/02/2024 EXAM: 4 VIEW(S) XRAY OF THE LUMBAR SPINE 09/02/2024 05:06:23 PM COMPARISON: None available. CLINICAL HISTORY: Severe lumbar back pain. FINDINGS: LUMBAR SPINE: BONES: No acute fracture. No aggressive appearing osseous lesion. Alignment is normal. DISCS AND DEGENERATIVE CHANGES: No severe degenerative changes. SOFT TISSUES: No acute abnormality. Colonic stool burden suggests constipation. IMPRESSION: 1. No acute abnormality of the lumbar spine. 2. Colonic stool burden suggests constipation. Electronically signed by: Rockey Kilts MD 09/02/2024 05:55 PM EST RP Workstation: [id number]    Procedures Procedures (including critical care time)  Medications Ordered in UC Medications - No data to display  Initial Impression / Assessment and Plan / UC Course  I have reviewed the triage vital signs and the nursing notes.  Pertinent labs & imaging  results that were available during my care of the patient were reviewed by me and considered in my medical decision making (see chart for details).      Final Clinical Impressions(s) / UC Diagnoses   Final diagnoses:  Acute low back pain with bilateral sciatica, unspecified back pain laterality   Low back pain with bilateral leg weakness and lumbar radiculopathy Acute onset of low back pain since Saturday night, progressively worsening. Pain localized at the base of the back, radiating into the buttocks and down both legs, associated with bilateral leg weakness. No recent trauma or heavy lifting. Pain exacerbated by certain positions and not relieved by ibuprofen  or acetaminophen . Differential diagnosis includes disc herniation or spinal infection, given the history of back issues and current symptoms. X-ray of the lower lumbar spine showed no obvious fractures, but soft tissue issues remain a concern. - Ordered x-ray of the lower lumbar spine Recommend evaluation in the ER with advanced imaging due to level of pain as well as concern for potential disc injury or other spinal etiology not demonstrated on x-ray.  Patient is in agreement states that he will go via private vehicle.  Declines EMS transportation.  Recurrent hematuria Intermittent hematuria with episodes of blood in urine. Previous evaluations for kidney stones have been negative. Upcoming cystoscopy scheduled for further investigation. Current back pain and hematuria do not appear related to kidney stones, as pain is localized lower than typical for kidney stones. - Proceed with scheduled cystoscopy for further evaluation of hematuria  Discharge Instructions   None    ED Prescriptions   None    PDMP not reviewed this encounter.   Marylene Rocky BRAVO, PA-C 09/03/24 [phone]

## 2024-09-03 ENCOUNTER — Emergency Department (HOSPITAL_BASED_OUTPATIENT_CLINIC_OR_DEPARTMENT_OTHER)

## 2024-09-03 ENCOUNTER — Emergency Department (HOSPITAL_COMMUNITY)

## 2024-09-03 MED ORDER — HYDROMORPHONE HCL 1 MG/ML IJ SOLN
1.0000 mg | Freq: Once | INTRAMUSCULAR | Status: AC
  Administered 2024-09-03: 1 mg via INTRAVENOUS
  Filled 2024-09-03: qty 1

## 2024-09-03 MED ORDER — IOHEXOL 300 MG/ML  SOLN
100.0000 mL | Freq: Once | INTRAMUSCULAR | Status: AC | PRN
  Administered 2024-09-03: 100 mL via INTRAVENOUS

## 2024-09-03 NOTE — ED Triage Notes (Signed)
 Pt sent over from [facility] for MRI, no new complaints. vss

## 2024-09-03 NOTE — ED Notes (Signed)
 Pt requested IV to be removed. Pt left

## 2024-09-03 NOTE — ED Provider Notes (Signed)
 [facility] Emergency Department Provider Note MRN:  [id number]  Arrival date & time: 09/03/24     Chief Complaint   Back Pain   History of Present Illness   Carl Tanner is a 38 y.o. year-old male with no pertinent past medical history presenting to the ED with chief complaint of back pain.  Worsening back pain over the past few days, does not recall an inciting event, but getting really bad.  Located in the midline lower back with radiation down bilateral buttocks.  Legs feel weak, slow to respond.  No bowel or bladder dysfunction, having some formed stools, some pain with bowel movements, some blood in urine.  Review of Systems  A thorough review of systems was obtained and all systems are negative except as noted in the HPI and PMH.   Patient's Health History    Past Medical History:  Diagnosis Date   Thyroid disease     Past Surgical History:  Procedure Laterality Date   ANKLE SURGERY     APPENDECTOMY     BRAIN SURGERY     FASCIECTOMY     HEMORROIDECTOMY     THYROIDECTOMY      Family History  Problem Relation Age of Onset   Thyroid disease Mother     Social History   Socioeconomic History   Marital status: Married    Spouse name: Not on file   Number of children: Not on file   Years of education: Not on file   Highest education level: Not on file  Occupational History   Not on file  Tobacco Use   Smoking status: Never   Smokeless tobacco: Never  Vaping Use   Vaping status: Some Days  Substance and Sexual Activity   Alcohol use: Never   Drug use: Never   Sexual activity: Yes    Birth control/protection: None  Other Topics Concern   Not on file  Social History Narrative   Not on file   Social Drivers of Health   Financial Resource Strain: Low Risk  (05/24/2019)   Received from [facility]   Overall Financial Resource Strain (CARDIA)    Difficulty of Paying Living Expenses: Not hard at all  Food  Insecurity: No Food Insecurity (05/24/2019)   Received from [facility]   Hunger Vital Sign    Within the past 12 months, you worried that your food would run out before you got the money to buy more.: Never true    Within the past 12 months, the food you bought just didn't last and you didn't have money to get more.: Never true  Transportation Needs: No Transportation Needs (05/24/2019)   Received from [facility] - Transportation    In the past 12 months, has lack of transportation kept you from medical appointments or from getting medications?: No    Lack of Transportation (Non-Medical): No  Physical Activity: Inactive (05/24/2019)   Received from [facility]   Exercise Vital Sign    On average, how many days per week do you engage in moderate to strenuous exercise (like a brisk walk)?: 0 days    On average, how many minutes do you engage in exercise at this level?: 0 min  Stress: No Stress Concern Present (05/24/2019)   Received from [facility] - Occupational Stress Questionnaire    Feeling of Stress : Not at all  Social Connections: Unknown (02/10/2022)   Received from [facility]   Social Network    Social Network: Not on file  Intimate Partner Violence: Unknown (01/13/2022)   Received from [facility]   HITS    Physically Hurt: Not on file    Insult or Talk Down To: Not on file    Threaten Physical Harm: Not on file    Scream or Curse: Not on file     Physical Exam   Vitals:   09/03/24 0023 09/03/24 0025  BP:  129/82  Pulse:  80  Resp:  19  Temp: 98.8 F (37.1 C)   SpO2:  100%    CONSTITUTIONAL: Well-appearing, moderate distress due to pain NEURO/PSYCH:  Alert and oriented x 3, no focal deficits EYES:  eyes equal and reactive ENT/NECK:  no LAD, no JVD CARDIO: Regular rate, well-perfused, normal S1 and S2 PULM:  CTAB no wheezing or  rhonchi GI/GU:  non-distended, non-tender MSK/SPINE:  No gross deformities, no edema SKIN:  no rash, atraumatic   *Additional and/or pertinent findings included in MDM below  Diagnostic and Interventional Summary    EKG Interpretation Date/Time:    Ventricular Rate:    PR Interval:    QRS Duration:    QT Interval:    QTC Calculation:   R Axis:      Text Interpretation:         Labs Reviewed  BASIC METABOLIC PANEL WITH GFR - Abnormal; Notable for the following components:      Result Value   Glucose, Bld 139 (*)    All other components within normal limits  CBC - Abnormal; Notable for the following components:   RBC 6.28 (*)    MCV 78.5 (*)    MCH 25.2 (*)    All other components within normal limits  URINALYSIS, ROUTINE W REFLEX MICROSCOPIC - Abnormal; Notable for the following components:   Ketones, ur TRACE (*)    Leukocytes,Ua TRACE (*)    All other components within normal limits    CT ABDOMEN PELVIS W CONTRAST  Final Result    CT L-SPINE NO CHARGE  Final Result    MR LUMBAR SPINE WO CONTRAST    (Results Pending)    Medications  HYDROmorphone  (DILAUDID ) injection 1 mg (1 mg Intravenous Given 09/03/24 0018)  iohexol  (OMNIPAQUE ) 300 MG/ML solution 100 mL (100 mLs Intravenous Contrast Given 09/03/24 0108)  HYDROmorphone  (DILAUDID ) injection 1 mg (1 mg Intravenous Given 09/03/24 0129)     Procedures  /  Critical Care Procedures  ED Course and Medical Decision Making  Initial Impression and Ddx Differential diagnosis includes MSK back pain, myelopathy is a consideration given the endorsed leg weakness.  Past medical/surgical history that increases complexity of ED encounter: None  Interpretation of Diagnostics I personally reviewed the Laboratory Testing and my interpretation is as follows: No significant blood count or electrolyte disturbance.  CT imaging revealing no significant or abdominal findings.  There is evidence of a central disc protrusion in  the lumbar spine  Patient Reassessment and Ultimate Disposition/Management     Patient was continued leg weakness, concern for possible myelopathy.  Plan is for transfer to [facility] emergency department for MRI imaging.  Accepting ED physician is Dr. Charmaine Bough.  Patient management required discussion with the following services or consulting groups:  None  Complexity of Problems Addressed Acute illness or injury that poses threat of life of bodily function  Additional Data Reviewed and Analyzed Further history obtained from:  Prior labs/imaging results  Additional Factors Impacting ED Encounter Risk Use of parenteral controlled substances and Consideration of hospitalization  Ozell HERO. Theadore, MD [facility] Emergency Medicine [facility] [email or web address]  Final Clinical Impressions(s) / ED Diagnoses     ICD-10-CM   1. Acute midline low back pain with bilateral sciatica  M54.42    M54.41       ED Discharge Orders     None        Discharge Instructions Discussed with and Provided to Patient:     Discharge Instructions      We are transferring you to the [facility] emergency department for MRI.  You have elected to travel via private vehicle.  Please go straight to [facility] emergency department.       Theadore Ozell HERO, MD 09/03/24 [phone]

## 2024-09-03 NOTE — Discharge Instructions (Addendum)
 We are transferring you to the [facility] emergency department for MRI.  You have elected to travel via private vehicle.  Please go straight to [facility] emergency department.
